# Patient Record
Sex: Female | Born: 1985 | Race: Black or African American | Hispanic: No | Marital: Married | Smoking: Never smoker
Health system: Southern US, Community
[De-identification: ages and names within clinical notes are randomized; demographics above are authoritative.]

## PROBLEM LIST (undated history)

## (undated) ENCOUNTER — Inpatient Hospital Stay (HOSPITAL_COMMUNITY): Payer: Self-pay

## (undated) DIAGNOSIS — J302 Other seasonal allergic rhinitis: Secondary | ICD-10-CM

## (undated) DIAGNOSIS — J40 Bronchitis, not specified as acute or chronic: Secondary | ICD-10-CM

---

## 2017-05-24 ENCOUNTER — Encounter (HOSPITAL_COMMUNITY): Payer: Self-pay | Admitting: Nurse Practitioner

## 2017-05-24 ENCOUNTER — Emergency Department (HOSPITAL_COMMUNITY)
Admission: EM | Admit: 2017-05-24 | Discharge: 2017-05-24 | Disposition: A | Discharge status: Home / Self Care | Payer: Self-pay | Attending: Emergency Medicine | Admitting: Emergency Medicine

## 2017-05-24 ENCOUNTER — Other Ambulatory Visit: Payer: Self-pay

## 2017-05-24 DIAGNOSIS — Y939 Activity, unspecified: Secondary | ICD-10-CM | POA: Insufficient documentation

## 2017-05-24 DIAGNOSIS — S0101XA Laceration without foreign body of scalp, initial encounter: Secondary | ICD-10-CM | POA: Insufficient documentation

## 2017-05-24 DIAGNOSIS — Y92410 Unspecified street and highway as the place of occurrence of the external cause: Secondary | ICD-10-CM | POA: Insufficient documentation

## 2017-05-24 DIAGNOSIS — Z79899 Other long term (current) drug therapy: Secondary | ICD-10-CM | POA: Insufficient documentation

## 2017-05-24 DIAGNOSIS — T07XXXA Unspecified multiple injuries, initial encounter: Secondary | ICD-10-CM

## 2017-05-24 DIAGNOSIS — Y999 Unspecified external cause status: Secondary | ICD-10-CM | POA: Insufficient documentation

## 2017-05-24 HISTORY — DX: Other seasonal allergic rhinitis: J30.2

## 2017-05-24 HISTORY — DX: Bronchitis, not specified as acute or chronic: J40

## 2017-05-24 MED ORDER — HYDROCODONE-ACETAMINOPHEN 5-325 MG PO TABS: 1.0000 | ORAL_TABLET | ORAL | 0 refills | Status: DC | PRN

## 2017-05-24 NOTE — Discharge Instructions (Signed)
Continue to use ice on the sore spots today and tomorrow after that use heat.  Clean the wound on your scalp daily with soap and water then apply an ointment such as bacitracin until it heals.  Return here, if needed, for problems.
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
## 2017-05-24 NOTE — ED Notes (Signed)
Pt updated about her wait time and our room situation. She verbalizes understanding.

## 2017-10-16 ENCOUNTER — Inpatient Hospital Stay (HOSPITAL_COMMUNITY): Payer: Self-pay

## 2017-10-16 ENCOUNTER — Other Ambulatory Visit: Payer: Self-pay

## 2017-10-16 ENCOUNTER — Inpatient Hospital Stay (HOSPITAL_COMMUNITY)
Admission: AD | Admit: 2017-10-16 | Discharge: 2017-10-16 | Disposition: A | Discharge status: Home / Self Care | Payer: Self-pay | Source: Ambulatory Visit | Attending: Obstetrics and Gynecology | Admitting: Obstetrics and Gynecology

## 2017-10-16 ENCOUNTER — Encounter (HOSPITAL_COMMUNITY): Payer: Self-pay | Admitting: *Deleted

## 2017-10-16 DIAGNOSIS — R109 Unspecified abdominal pain: Secondary | ICD-10-CM | POA: Insufficient documentation

## 2017-10-16 DIAGNOSIS — O26891 Other specified pregnancy related conditions, first trimester: Secondary | ICD-10-CM | POA: Insufficient documentation

## 2017-10-16 DIAGNOSIS — Z3A01 Less than 8 weeks gestation of pregnancy: Secondary | ICD-10-CM | POA: Insufficient documentation

## 2017-10-16 DIAGNOSIS — Z3491 Encounter for supervision of normal pregnancy, unspecified, first trimester: Secondary | ICD-10-CM

## 2017-10-16 DIAGNOSIS — O26899 Other specified pregnancy related conditions, unspecified trimester: Secondary | ICD-10-CM

## 2017-10-16 LAB — CBC
HEMATOCRIT: 40.9 % (ref 36.0–46.0)
Hemoglobin: 13.8 g/dL (ref 12.0–15.0)
MCH: 28.4 pg (ref 26.0–34.0)
MCHC: 33.7 g/dL (ref 30.0–36.0)
MCV: 84.2 fL (ref 78.0–100.0)
Platelets: 256 10*3/uL (ref 150–400)
RBC: 4.86 MIL/uL (ref 3.87–5.11)
RDW: 13.1 % (ref 11.5–15.5)
WBC: 14.9 10*3/uL — ABNORMAL HIGH (ref 4.0–10.5)

## 2017-10-16 LAB — URINALYSIS, ROUTINE W REFLEX MICROSCOPIC
BILIRUBIN URINE: NEGATIVE
GLUCOSE, UA: NEGATIVE mg/dL
HGB URINE DIPSTICK: NEGATIVE
Ketones, ur: 80 mg/dL — AB
LEUKOCYTES UA: NEGATIVE
NITRITE: NEGATIVE
PROTEIN: 100 mg/dL — AB
SPECIFIC GRAVITY, URINE: 1.035 — AB (ref 1.005–1.030)
pH: 6 (ref 5.0–8.0)

## 2017-10-16 LAB — WET PREP, GENITAL
CLUE CELLS WET PREP: NONE SEEN
SPERM: NONE SEEN
Trich, Wet Prep: NONE SEEN
Yeast Wet Prep HPF POC: NONE SEEN

## 2017-10-16 LAB — POCT PREGNANCY, URINE: PREG TEST UR: POSITIVE — AB

## 2017-10-16 LAB — ABO/RH: ABO/RH(D): A POS

## 2017-10-16 LAB — HCG, QUANTITATIVE, PREGNANCY: hCG, Beta Chain, Quant, S: 52998 m[IU]/mL — ABNORMAL HIGH (ref <5)

## 2017-10-16 MED ORDER — METOCLOPRAMIDE HCL 10 MG PO TABS: 10.0000 mg | ORAL_TABLET | Freq: Four times a day (QID) | ORAL | 0 refills | Status: DC

## 2017-10-16 MED ORDER — LACTATED RINGERS IV BOLUS
1000.0000 mL | Freq: Once | INTRAVENOUS | Status: AC
  Administered 2017-10-16: 1000 mL via INTRAVENOUS

## 2017-10-16 MED ORDER — ONDANSETRON 8 MG PO TBDP: 8.0000 mg | ORAL_TABLET | Freq: Three times a day (TID) | ORAL | 0 refills | Status: DC | PRN

## 2017-10-16 MED ORDER — GLYCOPYRROLATE 1 MG PO TABS: 1.0000 mg | ORAL_TABLET | Freq: Three times a day (TID) | ORAL | 0 refills | Status: DC

## 2017-10-16 MED ORDER — ONDANSETRON HCL 4 MG/2ML IJ SOLN
4.0000 mg | Freq: Once | INTRAMUSCULAR | Status: AC
  Administered 2017-10-16: 4 mg via INTRAVENOUS
  Filled 2017-10-16: qty 2

## 2017-10-16 MED ORDER — M.V.I. ADULT IV INJ
Freq: Once | INTRAVENOUS | Status: AC
  Administered 2017-10-16: 20:00:00 via INTRAVENOUS
  Filled 2017-10-16: qty 10

## 2017-10-16 MED ORDER — RANITIDINE HCL 150 MG PO TABS: 150.0000 mg | ORAL_TABLET | Freq: Two times a day (BID) | ORAL | 0 refills | Status: DC

## 2017-10-16 MED ORDER — PROMETHAZINE HCL 25 MG PO TABS: 25.0000 mg | ORAL_TABLET | Freq: Four times a day (QID) | ORAL | 0 refills | Status: DC | PRN

## 2017-10-16 MED ORDER — GLYCOPYRROLATE 0.2 MG/ML IJ SOLN
0.1000 mg | Freq: Once | INTRAMUSCULAR | Status: AC
  Administered 2017-10-16: 0.1 mg via INTRAVENOUS
  Filled 2017-10-16: qty 0.5

## 2017-10-16 MED ORDER — FAMOTIDINE IN NACL 20-0.9 MG/50ML-% IV SOLN
20.0000 mg | Freq: Once | INTRAVENOUS | Status: AC
Start: 2017-10-16 — End: 2017-10-16
  Administered 2017-10-16: 20 mg via INTRAVENOUS
  Filled 2017-10-16: qty 50

## 2017-10-16 MED ORDER — PROMETHAZINE HCL 25 MG/ML IJ SOLN
25.0000 mg | Freq: Once | INTRAMUSCULAR | Status: AC
  Administered 2017-10-16: 25 mg via INTRAVENOUS
  Filled 2017-10-16: qty 1
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
## 2017-10-16 NOTE — Discharge Instructions (Signed)
[facility]    [facility]       Phone: [phone]  [facility] Phone: [phone]  [facility] [place]  Phone: [phone]  [facility]  Phone: [phone]  [facility] [place]  Phone: [phone]  [facility]       Phone: [phone]  [facility]    Phone: [phone]   [facility]    Phone: [phone]  [facility]    Phone: [phone]  [facility]    Phone: [phone]  [facility]    Phone: [phone]  [facility]       Phone: [phone]   [facility]  Phone: [phone]  [facility]    Phone: [phone]  [facility] [place]   Phone: [phone]  [facility]      Phone: [phone]  [facility]    Phone: [phone]   First Trimester of Pregnancy The first trimester of pregnancy is from week 1 until the end of week 13 (months 1 through 3). A week after a sperm fertilizes an egg, the egg will implant on the wall of the uterus. This embryo will begin to develop into a baby. Genes from you and your partner will form the baby. The female genes will determine whether the baby will be a boy or a girl. At 6-8 weeks, the eyes and face will be formed, and the heartbeat can be seen on ultrasound. At the end of 12 weeks, all the baby's organs will be formed. Now that you are pregnant, you will want to do everything you can to have a healthy baby. Two of the most important things are to get good prenatal care and to follow your health care provider's instructions. Prenatal care is all the medical care you receive before the baby's birth.  This care will help prevent, find, and treat any problems during the pregnancy and childbirth. Body changes during your first trimester Your body goes through many changes during pregnancy. The changes vary from woman to woman.  You may gain or lose a couple of pounds at first.  You may feel sick to your stomach (nauseous) and you may throw up (vomit). If the vomiting is uncontrollable, call your health care provider.  You may tire easily.  You may develop headaches that can be relieved by medicines. All medicines should be approved by your health care provider.  You may urinate more often. Painful urination may mean you have a bladder infection.  You may develop heartburn as a result of your pregnancy.  You may develop constipation because certain hormones are causing the muscles that push stool through your intestines to slow down.  You may develop hemorrhoids or swollen veins (varicose veins).  Your breasts may begin to grow larger and become tender. Your nipples may stick out more, and the tissue that surrounds them (areola) may become darker.  Your gums may bleed and may be sensitive to brushing and flossing.  Dark spots or blotches (chloasma, mask of pregnancy) may develop on your face. This will likely fade after the baby is born.  Your menstrual periods will stop.  You may have a loss of appetite.  You may develop cravings for certain kinds of food.  You may have changes in your  emotions from day to day, such as being excited to be pregnant or being concerned that something may go wrong with the pregnancy and baby.  You may have more vivid and strange dreams.  You may have changes in your hair. These can include thickening of your hair, rapid growth, and changes in texture. Some women also have hair loss during or after pregnancy, or hair that feels dry or thin. Your hair will most likely return to normal after your baby is born.  What to expect at prenatal  visits During a routine prenatal visit:  You will be weighed to make sure you and the baby are growing normally.  Your blood pressure will be taken.  Your abdomen will be measured to track your baby's growth.  The fetal heartbeat will be listened to between weeks 10 and 14 of your pregnancy.  Test results from any previous visits will be discussed.  Your health care provider may ask you:  How you are feeling.  If you are feeling the baby move.  If you have had any abnormal symptoms, such as leaking fluid, bleeding, severe headaches, or abdominal cramping.  If you are using any tobacco products, including cigarettes, chewing tobacco, and electronic cigarettes.  If you have any questions.  Other tests that may be performed during your first trimester include:  Blood tests to find your blood type and to check for the presence of any previous infections. The tests will also be used to check for low iron levels (anemia) and protein on red blood cells (Rh antibodies). Depending on your risk factors, or if you previously had diabetes during pregnancy, you may have tests to check for high blood sugar that affects pregnant women (gestational diabetes).  Urine tests to check for infections, diabetes, or protein in the urine.  An ultrasound to confirm the proper growth and development of the baby.  Fetal screens for spinal cord problems (spina bifida) and Down syndrome.  HIV (human immunodeficiency virus) testing. Routine prenatal testing includes screening for HIV, unless you choose not to have this test.  You may need other tests to make sure you and the baby are doing well.  Follow these instructions at home: Medicines  Follow your health care provider's instructions regarding medicine use. Specific medicines may be either safe or unsafe to take during pregnancy.  Take a prenatal vitamin that contains at least 600 micrograms (mcg) of folic acid.  If you develop constipation, try  taking a stool softener if your health care provider approves. Eating and drinking  Eat a balanced diet that includes fresh fruits and vegetables, whole grains, good sources of protein such as meat, eggs, or tofu, and low-fat dairy. Your health care provider will help you determine the amount of weight gain that is right for you.  Avoid raw meat and uncooked cheese. These carry germs that can cause birth defects in the baby.  Eating four or five small meals rather than three large meals a day may help relieve nausea and vomiting. If you start to feel nauseous, eating a few soda crackers can be helpful. Drinking liquids between meals, instead of during meals, also seems to help ease nausea and vomiting.  Limit foods that are high in fat and processed sugars, such as fried and sweet foods.  To prevent constipation: ? Eat foods that are high in fiber, such as fresh fruits and vegetables, whole grains, and beans. ? Drink enough fluid to keep your urine clear or pale yellow. Activity  Exercise only as directed by your health care provider. Most women can continue their usual exercise routine during pregnancy. Try to exercise for 30 minutes at least 5 days a week. Exercising will help you: ? Control your weight. ? Stay in shape. ? Be prepared for labor and delivery.  Experiencing pain or cramping in the lower abdomen or lower back is a good sign that you should stop exercising. Check with your health care provider before continuing with normal exercises.  Try to avoid standing for long periods of time. Move your legs often if you must stand in one place for a long time.  Avoid heavy lifting.  Wear low-heeled shoes and practice good posture.  You may continue to have sex unless your health care provider tells you not to. Relieving pain and discomfort  Wear a good support bra to relieve breast tenderness.  Take warm sitz baths to soothe any pain or discomfort caused by hemorrhoids. Use  hemorrhoid cream if your health care provider approves.  Rest with your legs elevated if you have leg cramps or low back pain.  If you develop varicose veins in your legs, wear support hose. Elevate your feet for 15 minutes, 3-4 times a day. Limit salt in your diet. Prenatal care  Schedule your prenatal visits by the twelfth week of pregnancy. They are usually scheduled monthly at first, then more often in the last 2 months before delivery.  Write down your questions. Take them to your prenatal visits.  Keep all your prenatal visits as told by your health care provider. This is important. Safety  Wear your seat belt at all times when driving.  Make a list of emergency phone numbers, including numbers for family, friends, the hospital, and police and fire departments. General instructions  Ask your health care provider for a referral to a local prenatal education class. Begin classes no later than the beginning of month 6 of your pregnancy.  Ask for help if you have counseling or nutritional needs during pregnancy. Your health care provider can offer advice or refer you to specialists for help with various needs.  Do not use hot tubs, steam rooms, or saunas.  Do not douche or use tampons or scented sanitary pads.  Do not cross your legs for long periods of time.  Avoid cat litter boxes and soil used by cats. These carry germs that can cause birth defects in the baby and possibly loss of the fetus by miscarriage or stillbirth.  Avoid all smoking, herbs, alcohol, and medicines not prescribed by your health care provider. Chemicals in these products affect the formation and growth of the baby.  Do not use any products that contain nicotine or tobacco, such as cigarettes and e-cigarettes. If you need help quitting, ask your health care provider. You may receive counseling support and other resources to help you quit.  Schedule a dentist appointment. At home, brush your teeth with a soft  toothbrush and be gentle when you floss. Contact a health care provider if:  You have dizziness.  You have mild pelvic cramps, pelvic pressure, or nagging pain in the abdominal area.  You have persistent nausea, vomiting, or diarrhea.  You have a bad smelling vaginal discharge.  You have pain when you urinate.  You notice increased swelling in your face, hands, legs, or ankles.  You are exposed to fifth disease or chickenpox.  You are exposed to [place] measles (rubella) and have never had it. Get help right away if:  You have a fever.  You are leaking fluid from your vagina.  You have spotting or bleeding from your vagina.  You have severe abdominal cramping or pain.  You have rapid weight gain or loss.  You vomit blood or material that looks like coffee grounds.  You develop a severe headache.  You have shortness of breath.  You have any kind of trauma, such as from a fall or a car accident. Summary  The first trimester of pregnancy is from week 1 until the end of week 13 (months 1 through 3).  Your body goes through many changes during pregnancy. The changes vary from woman to woman.  You will have routine prenatal visits. During those visits, your health care provider will examine you, discuss any test results you may have, and talk with you about how you are feeling. This information is not intended to replace advice given to you by your health care provider. Make sure you discuss any questions you have with your health care provider. Document Released: 02/09/2001 Document Revised: 01/28/2016 Document Reviewed: 01/28/2016 Elsevier Interactive Patient Education  [employer]2018 [employer]

## 2017-10-16 NOTE — MAU Provider Note (Signed)
History     CSN: [id number]  Arrival date and time: 10/16/17 1612   First Provider Initiated Contact with Patient 10/16/17 1754      Chief Complaint  Patient presents with  . Abdominal Pain  . Emesis  . Possible Pregnancy   HPI Via Wynelle FannyOzuna is a 32 y.o. G3P0200 at 574w6d who presents  OB History    Gravida  3   Para  2   Term      Preterm  2   AB      Living        SAB      TAB      Ectopic      Multiple      Live Births              Past Medical History:  Diagnosis Date  . Bronchitis   . Seasonal allergies     No past surgical history on file.  No family history on file.  Social History   Tobacco Use  . Smoking status: Never Smoker  . Smokeless tobacco: Never Used  Substance Use Topics  . Alcohol use: Not Currently  . Drug use: Yes    Types: Marijuana    Allergies:  Allergies  Allergen Reactions  . Pollen Extract Hives, Shortness Of Breath and Itching  . Other Hives and Swelling    Pet Dander    Medications Prior to Admission  Medication Sig Dispense Refill Last Dose  . albuterol (PROVENTIL HFA;VENTOLIN HFA) 108 (90 Base) MCG/ACT inhaler USE 2 PUFFS PO Q 4 TO 6 H PRN SOB AND WHEEZING  0 Past Week at Unknown time  . cetirizine (ZYRTEC) 10 MG tablet Take 10 mg by mouth daily as needed for allergies.   05/23/2017 at Unknown time  . HYDROcodone-acetaminophen (NORCO) 5-325 MG tablet Take 1 tablet by mouth every 4 (four) hours as needed. 10 tablet 0     Review of Systems  Constitutional: Negative.  Negative for fatigue and fever.  HENT: Negative.   Respiratory: Negative.  Negative for shortness of breath.   Cardiovascular: Negative.  Negative for chest pain.  Gastrointestinal: Positive for abdominal pain, nausea and vomiting. Negative for constipation and diarrhea.  Genitourinary: Negative.  Negative for dysuria, vaginal bleeding and vaginal discharge.  Neurological: Negative.  Negative for dizziness and headaches.   Physical  Exam   Blood pressure 115/75, pulse 80, temperature 98.3 F (36.8 C), temperature source Oral, resp. rate 18, weight 57.3 kg, last menstrual period 08/03/2017, SpO2 99 %.  Physical Exam  Nursing note and vitals reviewed. Constitutional: She is oriented to person, place, and time. She appears well-developed and well-nourished. No distress.  HENT:  Head: Normocephalic.  Eyes: Pupils are equal, round, and reactive to light.  Cardiovascular: Normal rate, regular rhythm and normal heart sounds.  Respiratory: Effort normal and breath sounds normal. No respiratory distress.  GI: Soft. Bowel sounds are normal. She exhibits no distension. There is no tenderness.  Neurological: She is alert and oriented to person, place, and time.  Skin: Skin is warm and dry.  Psychiatric: She has a normal mood and affect. Her behavior is normal. Judgment and thought content normal.    MAU Course  Procedures Results for orders placed or performed during the hospital encounter of 10/16/17 (from the past 24 hour(s))  Urinalysis, Routine w reflex microscopic     Status: Abnormal   Collection Time: 10/16/17  4:54 PM  Result Value Ref Range   Color, Urine  AMBER (A) YELLOW   APPearance HAZY (A) CLEAR   Specific Gravity, Urine 1.035 (H) 1.005 - 1.030   pH 6.0 5.0 - 8.0   Glucose, UA NEGATIVE NEGATIVE mg/dL   Hgb urine dipstick NEGATIVE NEGATIVE   Bilirubin Urine NEGATIVE NEGATIVE   Ketones, ur 80 (A) NEGATIVE mg/dL   Protein, ur 409 (A) NEGATIVE mg/dL   Nitrite NEGATIVE NEGATIVE   Leukocytes, UA NEGATIVE NEGATIVE   RBC / HPF 0-5 0 - 5 RBC/hpf   WBC, UA 0-5 0 - 5 WBC/hpf   Bacteria, UA RARE (A) NONE SEEN   Squamous Epithelial / LPF 0-5 0 - 5   Mucus PRESENT   Pregnancy, urine POC     Status: Abnormal   Collection Time: 10/16/17  4:57 PM  Result Value Ref Range   Preg Test, Ur POSITIVE (A) NEGATIVE  Wet prep, genital     Status: Abnormal   Collection Time: 10/16/17  5:53 PM  Result Value Ref Range    Yeast Wet Prep HPF POC NONE SEEN NONE SEEN   Trich, Wet Prep NONE SEEN NONE SEEN   Clue Cells Wet Prep HPF POC NONE SEEN NONE SEEN   WBC, Wet Prep HPF POC FEW (A) NONE SEEN   Sperm NONE SEEN   CBC     Status: Abnormal   Collection Time: 10/16/17  6:11 PM  Result Value Ref Range   WBC 14.9 (H) 4.0 - 10.5 K/uL   RBC 4.86 3.87 - 5.11 MIL/uL   Hemoglobin 13.8 12.0 - 15.0 g/dL   HCT 81.1 91.4 - 78.2 %   MCV 84.2 78.0 - 100.0 fL   MCH 28.4 26.0 - 34.0 pg   MCHC 33.7 30.0 - 36.0 g/dL   RDW 95.6 21.3 - 08.6 %   Platelets 256 150 - 400 K/uL  hCG, quantitative, pregnancy     Status: Abnormal   Collection Time: 10/16/17  6:11 PM  Result Value Ref Range   hCG, Beta Chain, Quant, S 52,998 (H) <5 mIU/mL  ABO/Rh     Status: None (Preliminary result)   Collection Time: 10/16/17  6:11 PM  Result Value Ref Range   ABO/RH(D)      A POS Performed at [facility], [address], [place], [place] [address]    US Ob Less Than 14 Weeks With Ob Transvaginal  Result Date: 10/16/2017 CLINICAL DATA:  Lower abdominal pain EXAM: OBSTETRIC <14 WK [place] AND TRANSVAGINAL OB US TECHNIQUE: Both transabdominal and transvaginal ultrasound examinations were performed for complete evaluation of the gestation as well as the maternal uterus, adnexal regions, and pelvic cul-de-sac. Transvaginal technique was performed to assess early pregnancy. COMPARISON:  None. FINDINGS: Intrauterine gestational sac: Single intrauterine gestational sac Yolk sac:  Visible Embryo:  Visible Cardiac Activity: Visible Heart Rate: 104 bpm CRL: 3.5 mm   5 w   6 d                  [place] EDC: 06/12/2018 Subchorionic hemorrhage:  None visualized. Maternal uterus/adnexae: Ovaries are within normal limits. The right ovary measures 3.3 by 1.8 x 2.4 cm and contains a small corpus luteal cyst. The left ovary measures 3 x 1 x 1.4 cm. There is no significant free fluid. IMPRESSION: Single viable intrauterine pregnancy as above. There are no specific  abnormalities seen. Electronically Signed   By: Jasmine Pang M.D.   On: 10/16/2017 19:12   MDM UA, UPT CBC, HCG, ABO/Rh Wet prep and gc/chlamydia US OB Comp  Less 14 weeks with Transvaginal LR bolus Phenergan IV Pepcid IV Multivitamin in LR bolus Zofran 4mg  IV Robinul IV  Patient reporting relief from nausea. Requesting all medications for nausea because she states in previous pregnancies she has needed all of the meds.   Assessment and Plan   1. Normal IUP (intrauterine pregnancy) on prenatal ultrasound, first trimester   2. Abdominal pain affecting pregnancy   3. [redacted] weeks gestation of pregnancy    -Discharge home in stable condition -Rx for zofran, phenergan, reglan, robinul and pepcid given to patient. -First trimester vaginal bleeding and pain precautions discussed -Patient advised to follow-up with OB of choice for prenatal care -Patient may return to MAU as needed or if her condition were to change or worsen  Rolm BookbinderCaroline M Neill CNM 10/16/2017, 5:54 PM

## 2017-10-17 LAB — GC/CHLAMYDIA PROBE AMP (~~LOC~~) NOT AT ARMC
CHLAMYDIA, DNA PROBE: NEGATIVE
Neisseria Gonorrhea: NEGATIVE

## 2018-09-04 ENCOUNTER — Encounter (HOSPITAL_COMMUNITY): Payer: Self-pay

## 2019-12-28 IMAGING — US US OB < 14 WEEKS - US OB TV
1 series · 15 of 28 positions shown · non-contrast
Comparison: None.

CLINICAL DATA: Lower abdominal pain

EXAM:
OBSTETRIC <14 WK US AND TRANSVAGINAL OB US
TECHNIQUE: Both transabdominal and transvaginal ultrasound examinations were
performed for complete evaluation of the gestation as well as the
maternal uterus, adnexal regions, and pelvic cul-de-sac.
Transvaginal technique was performed to assess early pregnancy.

[Series 1: us ob < 14 weeks - us ob tv · 15 of 34 slices shown]
[im 1/34]
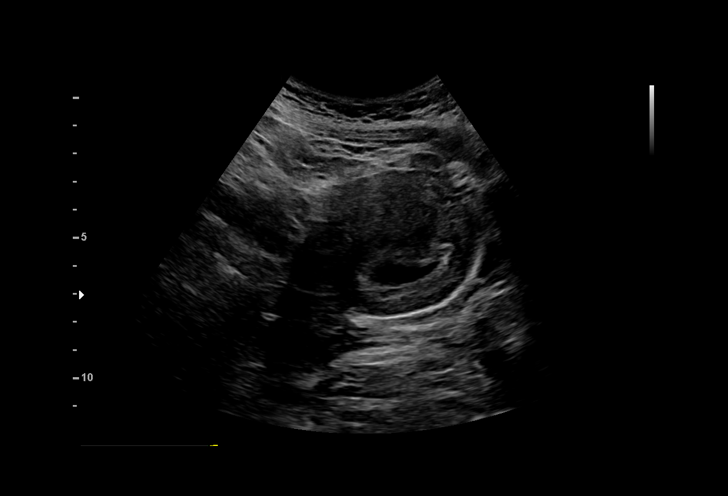
[im 3/34]
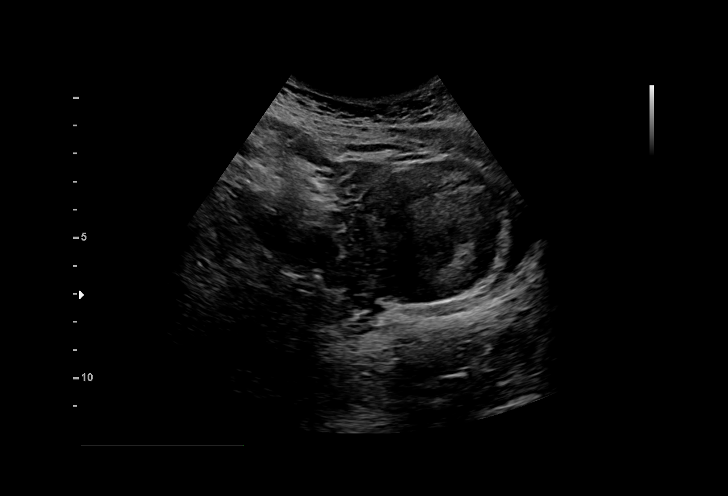
[im 5/34]
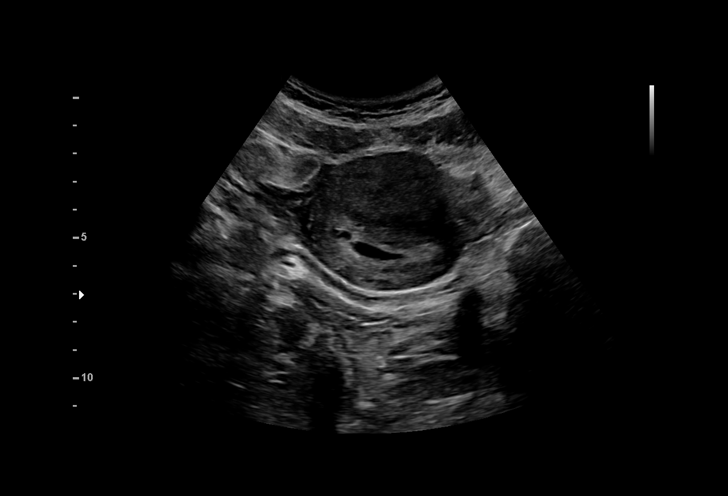
[im 8/34]
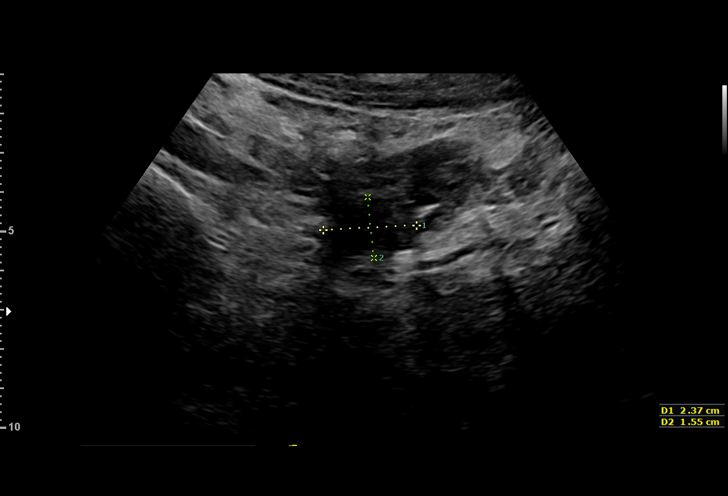
[im 10/34]
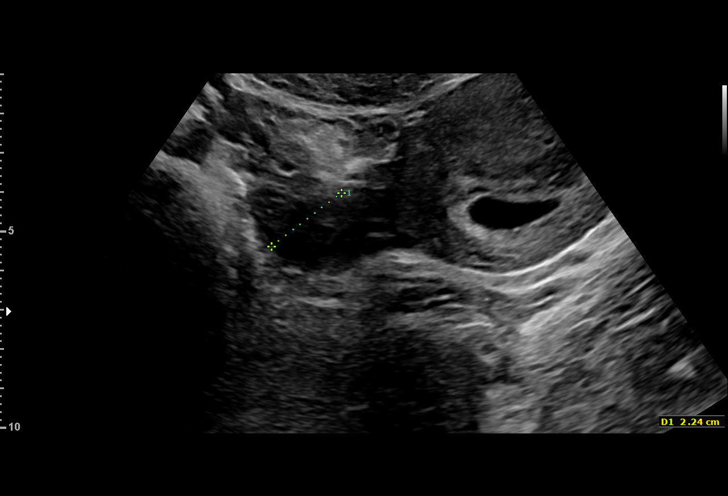
[im 13/34]
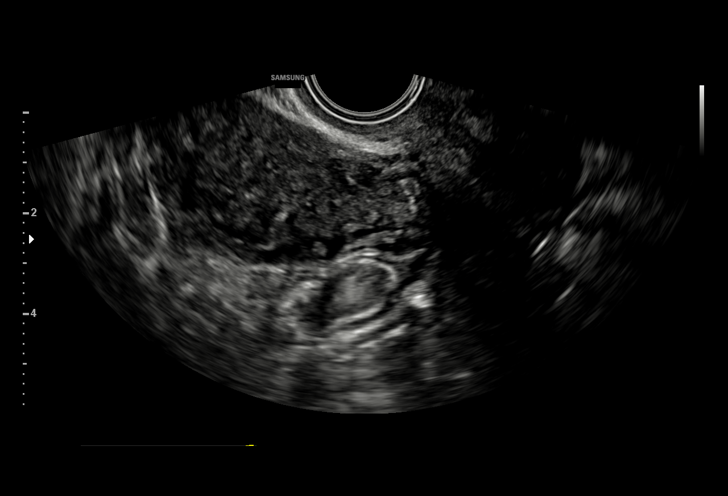
[im 15/34]
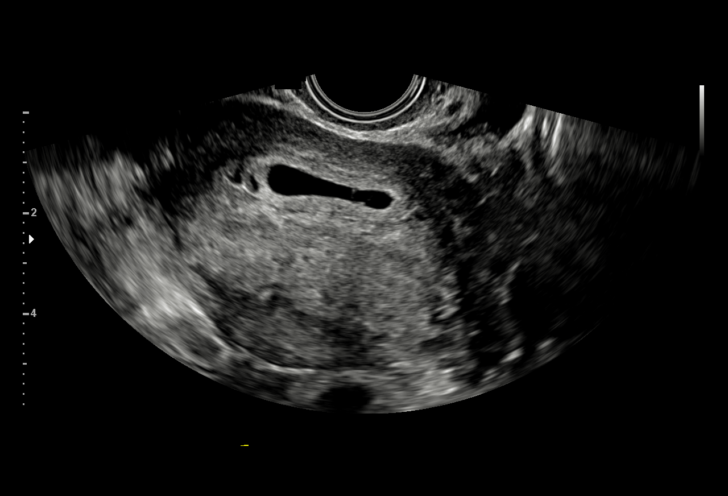
[im 18/34]
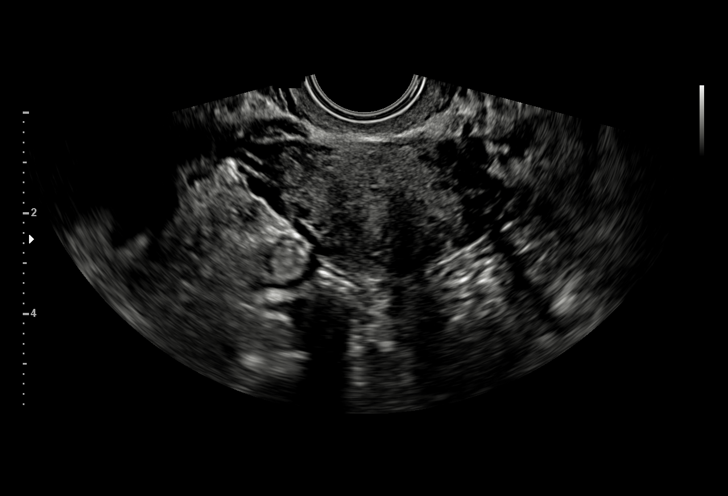
[im 19/34]
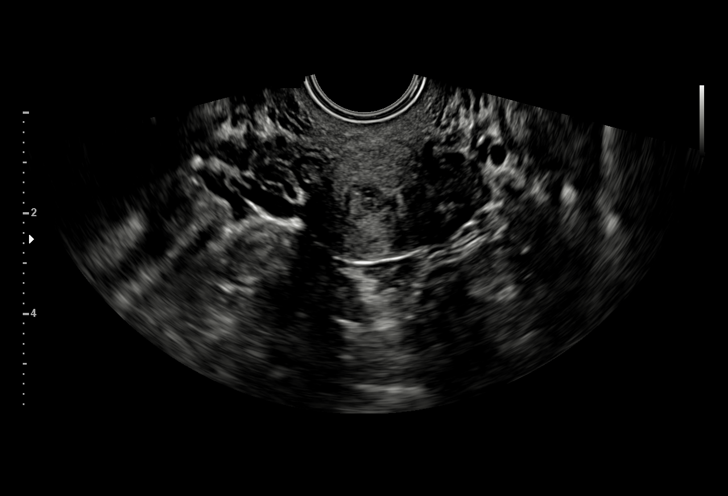
[im 21/34]
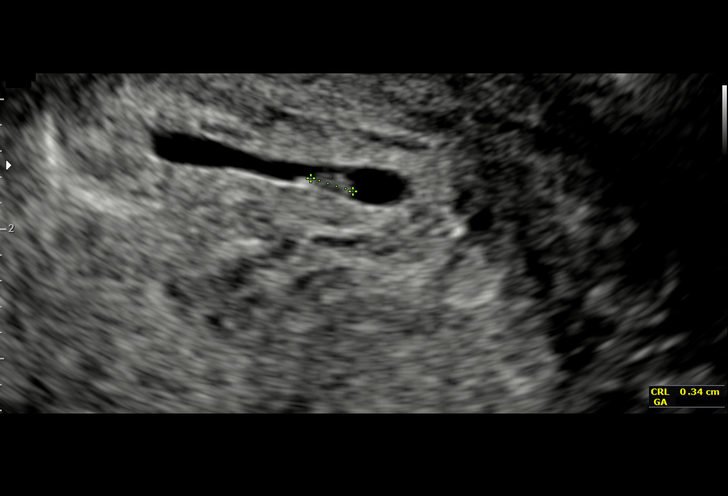
[im 24/34]
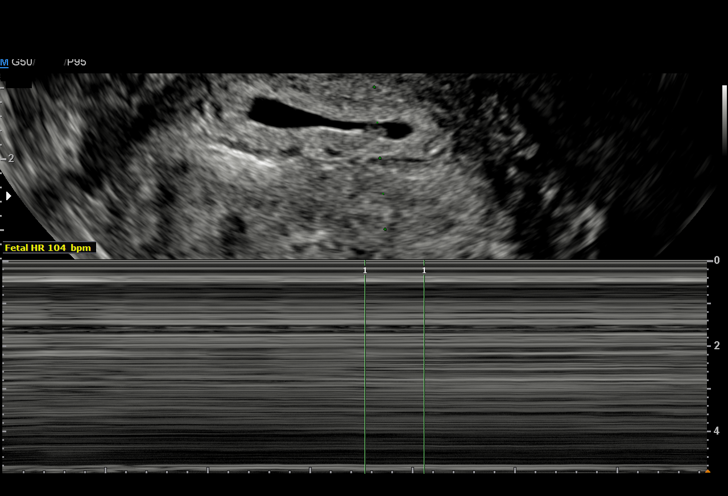
[im 26/34]
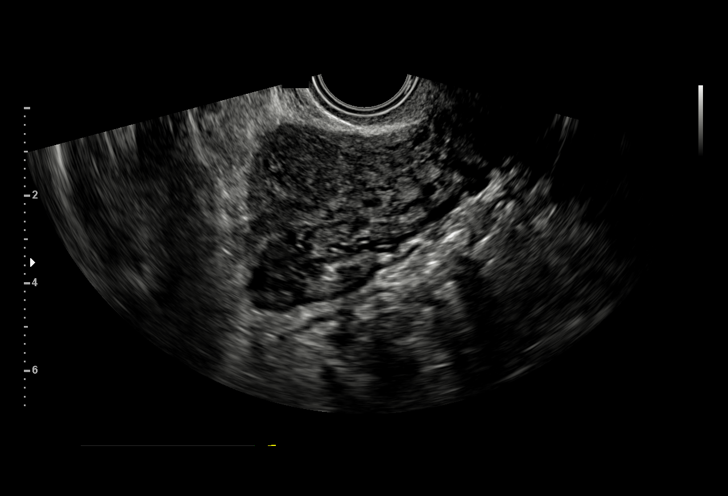
[im 29/34]
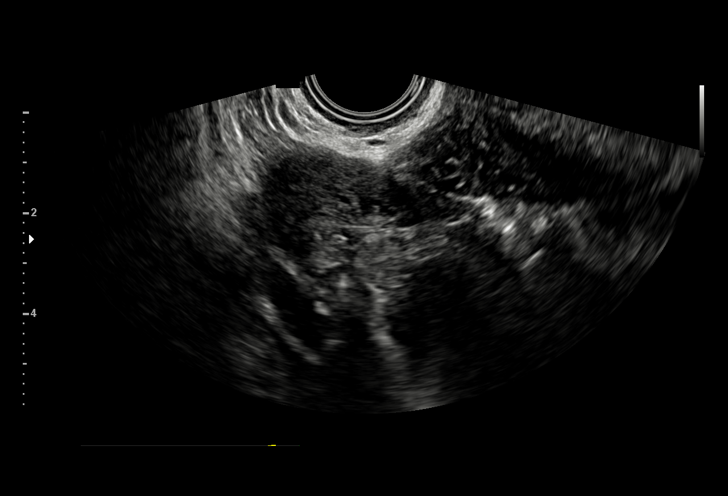
[im 31/34]
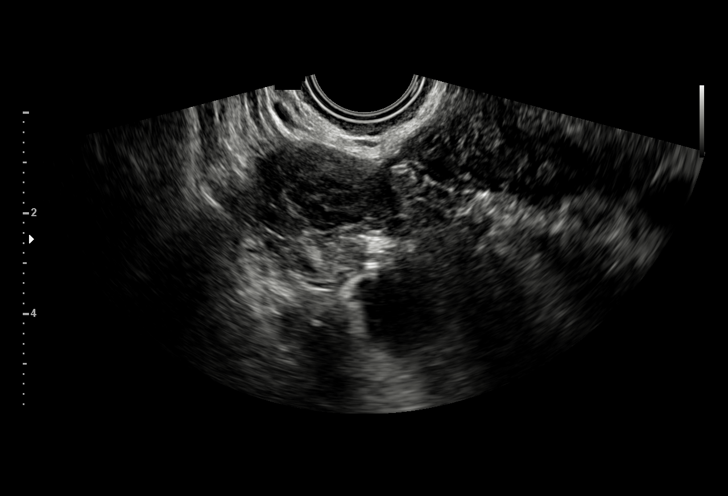
[im 34/34]
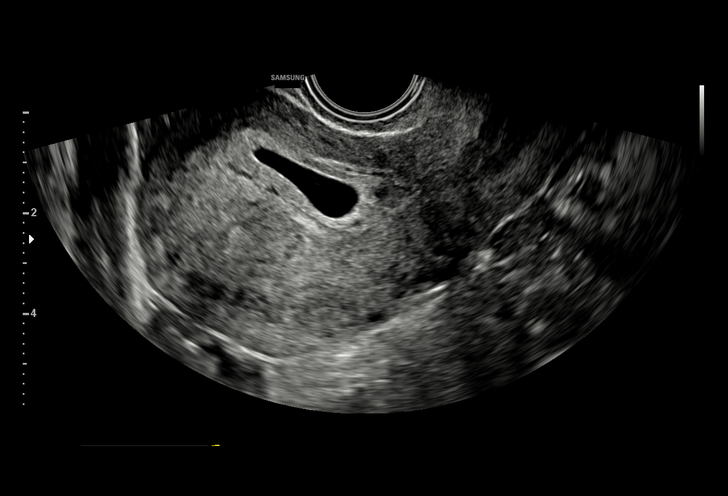

[15 of 28 positions shown; findings below may reference images not displayed]

FINDINGS: Intrauterine gestational sac: Single intrauterine gestational sac

Yolk sac:  Visible

Embryo:  Visible

Cardiac Activity: Visible

Heart Rate: 104 bpm

CRL: 3.5 mm   5 w   6 d                  US EDC: 06/12/2018

Subchorionic hemorrhage:  None visualized.

Maternal uterus/adnexae: Ovaries are within normal limits. The right
ovary measures 3.3 by 1.8 x 2.4 cm and contains a small corpus
luteal cyst. The left ovary measures 3 x 1 x 1.4 cm. There is no
significant free fluid.
IMPRESSION: Single viable intrauterine pregnancy as above. There are no specific
abnormalities seen.

## 2020-11-10 ENCOUNTER — Encounter (HOSPITAL_COMMUNITY): Payer: Self-pay | Admitting: Emergency Medicine

## 2020-11-10 ENCOUNTER — Ambulatory Visit (HOSPITAL_COMMUNITY)
Admission: EM | Admit: 2020-11-10 | Discharge: 2020-11-10 | Disposition: A | Discharge status: Home / Self Care | Payer: Medicaid Other | Attending: Internal Medicine | Admitting: Internal Medicine

## 2020-11-10 ENCOUNTER — Other Ambulatory Visit: Payer: Self-pay

## 2020-11-10 DIAGNOSIS — O210 Mild hyperemesis gravidarum: Secondary | ICD-10-CM

## 2020-11-10 DIAGNOSIS — Z5321 Procedure and treatment not carried out due to patient leaving prior to being seen by health care provider: Secondary | ICD-10-CM | POA: Diagnosis not present

## 2020-11-10 DIAGNOSIS — Z3A00 Weeks of gestation of pregnancy not specified: Secondary | ICD-10-CM

## 2020-11-10 LAB — CBC WITH DIFFERENTIAL/PLATELET
Abs Immature Granulocytes: 0.04 10*3/uL (ref 0.00–0.07)
Basophils Absolute: 0.1 10*3/uL (ref 0.0–0.1)
Basophils Relative: 1 %
Eosinophils Absolute: 0.2 10*3/uL (ref 0.0–0.5)
Eosinophils Relative: 1 %
HCT: 36.5 % (ref 36.0–46.0)
Hemoglobin: 12 g/dL (ref 12.0–15.0)
Immature Granulocytes: 0 %
Lymphocytes Relative: 19 %
Lymphs Abs: 2.3 10*3/uL (ref 0.7–4.0)
MCH: 28.3 pg (ref 26.0–34.0)
MCHC: 32.9 g/dL (ref 30.0–36.0)
MCV: 86.1 fL (ref 80.0–100.0)
Monocytes Absolute: 1 10*3/uL (ref 0.1–1.0)
Monocytes Relative: 8 %
Neutro Abs: 8.5 10*3/uL — ABNORMAL HIGH (ref 1.7–7.7)
Neutrophils Relative %: 71 %
Platelets: 230 10*3/uL (ref 150–400)
RBC: 4.24 MIL/uL (ref 3.87–5.11)
RDW: 12.7 % (ref 11.5–15.5)
WBC: 12.1 10*3/uL — ABNORMAL HIGH (ref 4.0–10.5)
nRBC: 0 % (ref 0.0–0.2)

## 2020-11-10 LAB — BASIC METABOLIC PANEL
Anion gap: 7 (ref 5–15)
BUN: 9 mg/dL (ref 6–20)
CO2: 22 mmol/L (ref 22–32)
Calcium: 8.7 mg/dL — ABNORMAL LOW (ref 8.9–10.3)
Chloride: 104 mmol/L (ref 98–111)
Creatinine, Ser: 0.45 mg/dL (ref 0.44–1.00)
GFR, Estimated: >60 mL/min (ref >60)
Glucose, Bld: 87 mg/dL (ref 70–99)
Potassium: 3.3 mmol/L — ABNORMAL LOW (ref 3.5–5.1)
Sodium: 133 mmol/L — ABNORMAL LOW (ref 135–145)

## 2020-11-10 LAB — POC URINE PREG, ED: Preg Test, Ur: POSITIVE — AB

## 2020-11-10 MED ORDER — SODIUM CHLORIDE 0.9 % IV BOLUS
500.0000 mL | Freq: Once | INTRAVENOUS | Status: AC
  Administered 2020-11-10: 500 mL via INTRAVENOUS

## 2020-11-10 MED ORDER — DOXYLAMINE-PYRIDOXINE 10-10 MG PO TBEC: 1.0000 | DELAYED_RELEASE_TABLET | Freq: Two times a day (BID) | ORAL | 0 refills | Status: DC

## 2020-11-10 MED ORDER — SODIUM CHLORIDE 0.9 % IV BOLUS: 1000.0000 mL | Freq: Once | INTRAVENOUS | Status: DC

## 2020-11-10 NOTE — ED Provider Notes (Signed)
[facility]    CSN: [id number] Arrival date & time: 11/10/20  1437      History   Chief Complaint Chief Complaint  Patient presents with   Emesis During Pregnancy    HPI Gail Sims is a 35 y.o. female comes to the urgent care with vomiting and nausea over the past several days.  Patient recently found out she was pregnant.  Her last menstrual period was end of July.  She has had nausea and vomiting with poor oral intake over the past several days.  She feels weak but denies any dizziness or near syncopal episodes.  She has tried Dramamine over-the-counter with no improvement in his symptoms.  She is also taking Nexium for heartburn.  No abdominal pain or cramping.  She has some epigastric discomfort but no lower abdominal pain.Marland Kitchen   HPI  Past Medical History:  Diagnosis Date   Bronchitis    Seasonal allergies     There are no problems to display for this patient.   Past Surgical History:  Procedure Laterality Date   CESAREAN SECTION      OB History     Gravida  4   Para  2   Term      Preterm  2   AB      Living         SAB      IAB      Ectopic      Multiple      Live Births               Home Medications    Prior to Admission medications   Medication Sig Start Date End Date Taking? Authorizing Provider  Doxylamine-Pyridoxine 10-10 MG TBEC Take 1 tablet by mouth in the morning and at bedtime. 11/10/20  Yes Chistopher Mangino, Britta Mccreedy, MD  esomeprazole (NEXIUM) 20 MG capsule Take 20 mg by mouth daily at 12 noon.   Yes [provider]  meclizine (ANTIVERT) 25 MG tablet Take 25 mg by mouth 3 (three) times daily as needed for dizziness.   Yes [provider]  albuterol (PROVENTIL HFA;VENTOLIN HFA) 108 (90 Base) MCG/ACT inhaler USE 2 PUFFS PO Q 4 TO 6 H PRN SOB AND WHEEZING 04/26/17   [provider]    Family History No family history on file.  Social History Social History   Tobacco Use   Smoking  status: Never   Smokeless tobacco: Never  Substance Use Topics   Alcohol use: Not Currently   Drug use: Not Currently    Types: Marijuana     Allergies   Pollen extract and Other   Review of Systems Review of Systems  HENT: Negative.    Respiratory: Negative.    Cardiovascular: Negative.   Gastrointestinal:  Positive for abdominal pain, nausea and vomiting. Negative for constipation and diarrhea.  Genitourinary: Negative.   Neurological: Negative.     Physical Exam Triage Vital Signs ED Triage Vitals  Enc Vitals Group     BP 11/10/20 1509 111/78     Pulse Rate 11/10/20 1509 89     Resp 11/10/20 1509 17     Temp 11/10/20 1509 98.5 F (36.9 C)     Temp Source 11/10/20 1509 Oral     SpO2 11/10/20 1509 97 %     Weight --      Height --      Head Circumference --      Peak Flow --  Pain Score 11/10/20 1506 9     Pain Loc --      Pain Edu? --      Excl. in GC? --    No data found.  Updated Vital Signs BP 111/78 (BP Location: Left Arm)   Pulse 89   Temp 98.5 F (36.9 C) (Oral)   Resp 17   LMP 08/29/2020   SpO2 97%   Visual Acuity Right Eye Distance:   Left Eye Distance:   Bilateral Distance:    Right Eye Near:   Left Eye Near:    Bilateral Near:     Physical Exam Vitals and nursing note reviewed.  Constitutional:      General: She is not in acute distress.    Appearance: She is not ill-appearing.  Cardiovascular:     Rate and Rhythm: Normal rate and regular rhythm.     Pulses: Normal pulses.     Heart sounds: Normal heart sounds.  Pulmonary:     Effort: Pulmonary effort is normal.     Breath sounds: Normal breath sounds.  Abdominal:     General: Abdomen is flat. Bowel sounds are normal.  Musculoskeletal:        General: Normal range of motion.  Skin:    General: Skin is warm.  Neurological:     Mental Status: She is alert.     UC Treatments / Results  Labs (all labs ordered are listed, but only abnormal results are displayed) Labs  Reviewed  POC URINE PREG, ED - Abnormal; Notable for the following components:      Result Value   Preg Test, Ur POSITIVE (*)    All other components within normal limits    EKG   Radiology No results found.  Procedures Procedures (including critical care time)  Medications Ordered in UC Medications  sodium chloride 0.9 % bolus 500 mL (500 mLs Intravenous New Bag/Given 11/10/20 1615)    Initial Impression / Assessment and Plan / UC Course  I have reviewed the triage vital signs and the nursing notes.  Pertinent labs & imaging results that were available during my care of the patient were reviewed by me and considered in my medical decision making (see chart for details).     1.  Hyperemesis gravidarum: Normal saline 500 mL bolus CBC, BMP Urine pregnancy test is positive Doxylamine-pyridoxine twice daily Patient encouraged to establish OB care If symptoms worsen patient advised to go to the MAU for further evaluation. Final Clinical Impressions(s) / UC Diagnoses   Final diagnoses:  None   Discharge Instructions   None    ED Prescriptions     Medication Sig Dispense Auth. Provider   Doxylamine-Pyridoxine 10-10 MG TBEC Take 1 tablet by mouth in the morning and at bedtime. 60 tablet Ambrosio Reuter, Britta Mccreedy, MD      PDMP not reviewed this encounter.   Merrilee Jansky, MD 11/10/20 [phone]

## 2020-11-10 NOTE — Discharge Instructions (Addendum)
Please take medications as prescribed Please establish OB care Increase oral fluid intake If you have worsening symptoms please go to the maternity assessment unit for [facility] health.

## 2020-11-10 NOTE — ED Triage Notes (Signed)
Pt reports that she recently found out she was pregnant. Reports been having vomiting and not able to keep anything down. No BM in 3 days. With first pregnancy had hyperemesis

## 2020-11-12 ENCOUNTER — Other Ambulatory Visit: Payer: Self-pay

## 2020-11-12 ENCOUNTER — Encounter (HOSPITAL_COMMUNITY): Payer: Self-pay | Admitting: Obstetrics and Gynecology

## 2020-11-12 ENCOUNTER — Inpatient Hospital Stay (HOSPITAL_COMMUNITY)
Admission: AD | Admit: 2020-11-12 | Discharge: 2020-11-12 | Discharge status: Against Medical Advice | Payer: Medicaid Other | Attending: Obstetrics and Gynecology | Admitting: Obstetrics and Gynecology

## 2020-11-12 DIAGNOSIS — Z5321 Procedure and treatment not carried out due to patient leaving prior to being seen by health care provider: Secondary | ICD-10-CM | POA: Diagnosis not present

## 2020-11-12 LAB — URINALYSIS, ROUTINE W REFLEX MICROSCOPIC
Bilirubin Urine: NEGATIVE
Glucose, UA: NEGATIVE mg/dL
Hgb urine dipstick: NEGATIVE
Ketones, ur: 80 mg/dL — AB
Leukocytes,Ua: NEGATIVE
Nitrite: NEGATIVE
Protein, ur: 30 mg/dL — AB
Specific Gravity, Urine: 1.031 — ABNORMAL HIGH (ref 1.005–1.030)
pH: 7 (ref 5.0–8.0)

## 2020-11-12 NOTE — MAU Note (Signed)
Pt called, not in lobby x2 

## 2020-11-12 NOTE — MAU Note (Signed)
Pt called, not in lobby x1 

## 2020-11-12 NOTE — MAU Note (Signed)
Pt called, not in lobby x3 

## 2020-11-12 NOTE — MAU Note (Signed)
Gail Sims is a 35 y.o. at [redacted]w[redacted]d here in MAU reporting: ongoing nausea, was Rx zofran from UC and that is not helping. States unable to keep down food. 7-8 episodes of emesis in the past 24 hours. No pain, bleeding, or discharge.  LMP: 08/29/2020  Onset of complaint: ongoing  Pain score: 0/10  Vitals:   11/12/20 1320  BP: 105/66  Pulse: 66  Resp: 16  Temp: 98.5 F (36.9 C)  SpO2: 100%     [id number]  Lab orders placed from triage: UA

## 2020-11-13 ENCOUNTER — Encounter (HOSPITAL_COMMUNITY): Payer: Self-pay | Admitting: Obstetrics and Gynecology

## 2020-11-13 ENCOUNTER — Inpatient Hospital Stay (HOSPITAL_COMMUNITY)
Admission: AD | Admit: 2020-11-13 | Discharge: 2020-11-13 | Disposition: A | Discharge status: Home / Self Care | Payer: Medicaid Other | Attending: Obstetrics and Gynecology | Admitting: Obstetrics and Gynecology

## 2020-11-13 ENCOUNTER — Other Ambulatory Visit: Payer: Self-pay

## 2020-11-13 DIAGNOSIS — R42 Dizziness and giddiness: Secondary | ICD-10-CM | POA: Insufficient documentation

## 2020-11-13 DIAGNOSIS — O219 Vomiting of pregnancy, unspecified: Secondary | ICD-10-CM | POA: Insufficient documentation

## 2020-11-13 DIAGNOSIS — Z3A10 10 weeks gestation of pregnancy: Secondary | ICD-10-CM | POA: Insufficient documentation

## 2020-11-13 DIAGNOSIS — R1030 Lower abdominal pain, unspecified: Secondary | ICD-10-CM | POA: Diagnosis not present

## 2020-11-13 DIAGNOSIS — R109 Unspecified abdominal pain: Secondary | ICD-10-CM | POA: Diagnosis not present

## 2020-11-13 DIAGNOSIS — O26891 Other specified pregnancy related conditions, first trimester: Secondary | ICD-10-CM | POA: Diagnosis not present

## 2020-11-13 LAB — COMPREHENSIVE METABOLIC PANEL
ALT: 14 U/L (ref 0–44)
AST: 20 U/L (ref 15–41)
Albumin: 4.1 g/dL (ref 3.5–5.0)
Alkaline Phosphatase: 44 U/L (ref 38–126)
Anion gap: 11 (ref 5–15)
BUN: 9 mg/dL (ref 6–20)
CO2: 21 mmol/L — ABNORMAL LOW (ref 22–32)
Calcium: 9.3 mg/dL (ref 8.9–10.3)
Chloride: 101 mmol/L (ref 98–111)
Creatinine, Ser: 0.7 mg/dL (ref 0.44–1.00)
GFR, Estimated: >60 mL/min (ref >60)
Glucose, Bld: 88 mg/dL (ref 70–99)
Potassium: 3.2 mmol/L — ABNORMAL LOW (ref 3.5–5.1)
Sodium: 133 mmol/L — ABNORMAL LOW (ref 135–145)
Total Bilirubin: 1 mg/dL (ref 0.3–1.2)
Total Protein: 7.2 g/dL (ref 6.5–8.1)

## 2020-11-13 LAB — URINALYSIS, ROUTINE W REFLEX MICROSCOPIC
Bilirubin Urine: NEGATIVE
Glucose, UA: NEGATIVE mg/dL
Hgb urine dipstick: NEGATIVE
Ketones, ur: 80 mg/dL — AB
Leukocytes,Ua: NEGATIVE
Nitrite: NEGATIVE
Protein, ur: 30 mg/dL — AB
Specific Gravity, Urine: 1.035 — ABNORMAL HIGH (ref 1.005–1.030)
pH: 5 (ref 5.0–8.0)

## 2020-11-13 MED ORDER — LACTATED RINGERS IV BOLUS
1000.0000 mL | Freq: Once | INTRAVENOUS | Status: AC
  Administered 2020-11-13: 1000 mL via INTRAVENOUS

## 2020-11-13 MED ORDER — METOCLOPRAMIDE HCL 10 MG PO TABS: 10.0000 mg | ORAL_TABLET | Freq: Three times a day (TID) | ORAL | 2 refills | Status: DC | PRN

## 2020-11-13 MED ORDER — SODIUM CHLORIDE 0.9 % IV SOLN
25.0000 mg | Freq: Once | INTRAVENOUS | Status: AC
  Administered 2020-11-13: 25 mg via INTRAVENOUS
  Filled 2020-11-13: qty 1

## 2020-11-13 MED ORDER — PROMETHAZINE HCL 12.5 MG PO TABS: 12.5000 mg | ORAL_TABLET | Freq: Every evening | ORAL | 2 refills | Status: DC | PRN

## 2020-11-13 MED ORDER — GLYCOPYRROLATE 0.2 MG/ML IJ SOLN
0.1000 mg | Freq: Once | INTRAMUSCULAR | Status: AC
  Administered 2020-11-13: 0.1 mg via INTRAVENOUS
  Filled 2020-11-13: qty 1

## 2020-11-13 MED ORDER — GLYCOPYRROLATE 2 MG PO TABS: 2.0000 mg | ORAL_TABLET | Freq: Three times a day (TID) | ORAL | 3 refills | Status: DC | PRN

## 2020-11-13 NOTE — Discharge Instructions (Signed)
Prenatal Care Providers           [facility]  [address] [phone]  [facility]   [address]  [phone]  [facility] [place]       [address] [phone]            [facility] @ [place]     [address] [phone]          [facility] @ [place]   [address] [phone]  [facility]  [address] [phone]     [facility])  [address]   [phone]     [facility]  Phone: [phone]  [facility]  Phone: [phone]  [facility] Phone: [phone]  [facility] Phone: [phone]  [facility] Phone: [phone]  [facility] Phone: [phone]  [facility]  Phone: [phone]  

## 2020-11-13 NOTE — MAU Note (Cosign Needed Addendum)
History     [id number]  Arrival date and time: 11/13/20 0930    Chief Complaint  Patient presents with   Emesis   Nausea   HPI Gail Sims is a 35 y.o. at [redacted]w[redacted]d by LMP with no significant PMHx who presents for emesis and nausea. She had episodes 7-10x a day that started 2.5 weeks ago that is exacerbated by eating and the smell of certain foods. She has noticed mild bleeding after each episode in her emesis. She endorses dizziness. Pt reported abdominal pain to nurse. She has no chest pain. Pt went to the Urgent Care on Monday and was given the diagnosis of hyperemesis gravidarum. She was started on Zofran, Nexium, and Doxylamine. Stated Nexium was moderately helpful   She admitted to MAU on Tuesday for ongoing nausea but left without being seen.   Vaginal bleeding: No LOF: No Fetal Movement: No Contractions: No   OB History     Gravida  3   Para  2   Term      Preterm  2   AB      Living  2      SAB      IAB      Ectopic      Multiple      Live Births              Past Medical History:  Diagnosis Date   Bronchitis    Seasonal allergies     Past Surgical History:  Procedure Laterality Date   CESAREAN SECTION      Family History  Problem Relation Age of Onset   Heart disease Mother    Cancer Mother    Cancer Maternal Grandfather     Social History   Socioeconomic History   Marital status: Married    Spouse name: Not on file   Number of children: Not on file   Years of education: Not on file   Highest education level: Not on file  Occupational History   Not on file  Tobacco Use   Smoking status: Never   Smokeless tobacco: Never  Substance and Sexual Activity   Alcohol use: Not Currently   Drug use: Not Currently    Types: Marijuana    Comment: last used marijuana when found out she was pregnant as of 11/13/2020   Sexual activity: Not Currently  Other Topics Concern   Not on file  Social History Narrative   Not on file    Social Determinants of Health   Financial Resource Strain: Not on file  Food Insecurity: Not on file  Transportation Needs: Not on file  Physical Activity: Not on file  Stress: Not on file  Social Connections: Not on file  Intimate Partner Violence: Not on file    Allergies  Allergen Reactions   Pollen Extract Hives, Shortness Of Breath and Itching   Other Hives and Swelling    Pet Dander    No current facility-administered medications on file prior to encounter.   Current Outpatient Medications on File Prior to Encounter  Medication Sig Dispense Refill   esomeprazole (NEXIUM) 20 MG capsule Take 20 mg by mouth daily at 12 noon.     ondansetron (ZOFRAN) 4 MG tablet Take 4 mg by mouth every 8 (eight) hours as needed for nausea or vomiting.     albuterol (PROVENTIL HFA;VENTOLIN HFA) 108 (90 Base) MCG/ACT inhaler USE 2 PUFFS PO Q 4 TO 6 H PRN SOB AND WHEEZING  0  Doxylamine-Pyridoxine 10-10 MG TBEC Take 1 tablet by mouth in the morning and at bedtime. 60 tablet 0   meclizine (ANTIVERT) 25 MG tablet Take 25 mg by mouth 3 (three) times daily as needed for dizziness.       Review of Systems  Constitutional:  Negative for fever.  Respiratory:  Positive for sputum production and shortness of breath.   Cardiovascular:  Negative for chest pain.  Gastrointestinal:  Positive for constipation, nausea and vomiting.  Neurological:  Positive for dizziness and headaches.    Physical Exam   BP 108/70 (BP Location: Right Arm)   Pulse 84   Temp 98.4 F (36.9 C) (Oral)   Resp 19   Ht 5\' 5"  (1.651 m)   Wt 60.5 kg   LMP 08/29/2020   SpO2 99%   BMI 22.18 kg/m   Patient Vitals for the past 24 hrs:  BP Temp Temp src Pulse Resp SpO2 Height Weight  11/13/20 0946 108/70 98.4 F (36.9 C) Oral 84 19 99 % 5\' 5"  (1.651 m) 60.5 kg    Physical Exam Constitutional:      Appearance: She is not diaphoretic.  HENT:     Head: Normocephalic and atraumatic.  Pulmonary:     Effort: Pulmonary  effort is normal.  Abdominal:     Tenderness: There is no abdominal tenderness.  Neurological:     Mental Status: She is alert.   Cervical Exam Not performed   Bedside Ultrasound Yes   My interpretation: Confirmed IUP with visible yolk sac and heart beat. GA [redacted]wk   Labs Results for orders placed or performed during the hospital encounter of 11/13/20 (from the past 24 hour(s))  Comprehensive metabolic panel     Status: Abnormal   Collection Time: 11/13/20 10:05 AM  Result Value Ref Range   Sodium 133 (L) 135 - 145 mmol/L   Potassium 3.2 (L) 3.5 - 5.1 mmol/L   Chloride 101 98 - 111 mmol/L   CO2 21 (L) 22 - 32 mmol/L   Glucose, Bld 88 70 - 99 mg/dL   BUN 9 6 - 20 mg/dL   Creatinine, Ser 11/15/20 0.44 - 1.00 mg/dL   Calcium 9.3 8.9 - 11/15/20 mg/dL   Total Protein 7.2 6.5 - 8.1 g/dL   Albumin 4.1 3.5 - 5.0 g/dL   AST 20 15 - 41 U/L   ALT 14 0 - 44 U/L   Alkaline Phosphatase 44 38 - 126 U/L   Total Bilirubin 1.0 0.3 - 1.2 mg/dL   GFR, Estimated 6.26 94.8 mL/min   Anion gap 11 5 - 15  Urinalysis, Routine w reflex microscopic Urine, Clean Catch     Status: Abnormal   Collection Time: 11/13/20 10:47 AM  Result Value Ref Range   Color, Urine AMBER (A) YELLOW   APPearance CLOUDY (A) CLEAR   Specific Gravity, Urine 1.035 (H) 1.005 - 1.030   pH 5.0 5.0 - 8.0   Glucose, UA NEGATIVE NEGATIVE mg/dL   Hgb urine dipstick NEGATIVE NEGATIVE   Bilirubin Urine NEGATIVE NEGATIVE   Ketones, ur 80 (A) NEGATIVE mg/dL   Protein, ur 30 (A) NEGATIVE mg/dL   Nitrite NEGATIVE NEGATIVE   Leukocytes,Ua NEGATIVE NEGATIVE   RBC / HPF 0-5 0 - 5 RBC/hpf   WBC, UA 0-5 0 - 5 WBC/hpf   Bacteria, UA RARE (A) NONE SEEN   Squamous Epithelial / LPF 21-50 0 - 5   Mucus PRESENT    Imaging No results found.  MAU Course  Procedures Lab Orders         Comprehensive metabolic panel         Urinalysis, Routine w reflex microscopic Urine, Clean Catch    Meds ordered this encounter  Medications   lactated ringers  bolus 1,000 mL   promethazine (PHENERGAN) 25 mg in sodium chloride 0.9 % 50 mL IVPB   glycopyrrolate (ROBINUL) injection 0.1 mg   lactated ringers bolus 1,000 mL   promethazine (PHENERGAN) 12.5 MG tablet    Sig: Take 1-2 tablets (12.5-25 mg total) by mouth at bedtime as needed for nausea or vomiting.    Dispense:  30 tablet    Refill:  2    Order Specific Question:   Supervising Provider    Answer:   Alysia Penna, MICHAEL L [[id number]]   metoCLOPramide (REGLAN) 10 MG tablet    Sig: Take 1 tablet (10 mg total) by mouth 3 (three) times daily with meals as needed for nausea.    Dispense:  30 tablet    Refill:  2    Order Specific Question:   Supervising Provider    Answer:   Nettie Elm L [[id number]]   Imaging Orders  No imaging studies ordered today    MDM moderate  Assessment and Plan  Gail Sims is a 35 y.o. at [redacted]w[redacted]d by LMP with no significant PMHx who presents for emesis and nausea. Pt has persistent emesis with no evidence of chest pain but signs of ketonuria and mild hypokalemia likely suggestive of hyperemesis gravidarum vs GERD. Pt's lower abdominal pain likely due to constipation. Discussed with pt the various pharmacological option for her emesis  Plan:  #Hyperemesis Gravidarum -Cont. Nexium 20mg   -Cont. IV LR fluids   -Start IV Phenergan 25mg  solution -Start IV Robinul injection 0.1mg   -Consider scopolamine patch for at home  -Consider pepcid for at home -Cont. Oral Zofran 4mg    #Lower abdominal pain  -Obtain CBC, U/S, b-hCG, and ABO-Rh to rule out ectopic pregnancy  #Disposition  -Discharge today   , CNM 11/13/20 12:51 PM  Allergies as of 11/13/2020       Reactions   Pollen Extract Hives, Shortness Of Breath, Itching   Other Hives, Swelling   Pet Dander        Medication List     STOP taking these medications    Doxylamine-Pyridoxine 10-10 MG Tbec   meclizine 25 MG tablet Commonly known as: ANTIVERT       TAKE these  medications    albuterol 108 (90 Base) MCG/ACT inhaler Commonly known as: VENTOLIN HFA USE 2 PUFFS PO Q 4 TO 6 H PRN SOB AND WHEEZING   esomeprazole 20 MG capsule Commonly known as: NEXIUM Take 20 mg by mouth daily at 12 noon.   metoCLOPramide 10 MG tablet Commonly known as: Reglan Take 1 tablet (10 mg total) by mouth 3 (three) times daily with meals as needed for nausea.   ondansetron 4 MG tablet Commonly known as: ZOFRAN Take 4 mg by mouth every 8 (eight) hours as needed for nausea or vomiting.   promethazine 12.5 MG tablet Commonly known as: PHENERGAN Take 1-2 tablets (12.5-25 mg total) by mouth at bedtime as needed for nausea or vomiting.        CNM attestation:  I have seen and examined this patient and agree with above documentation in the medical's note.   Gail Sims is a 35 y.o. 03-31-1982 at [redacted]w[redacted]d reporting n/v, abdominal pain, and constipation   PE: Patient Vitals for the  past 24 hrs:  BP Temp Temp src Pulse Resp SpO2 Height Weight  11/13/20 0946 108/70 98.4 F (36.9 C) Oral 84 19 99 % 5\' 5"  (1.651 m) 60.5 kg   Gen: calm comfortable, NAD Resp: normal effort, no distress Heart: Regular rate Abd: Soft, NT, gravid, S=D  FHR: unable to auscultate FHT with doppler Bedside by Dr [place] with IUP, normal FHR  ROS, labs, PMH reviewed  Orders Placed This Encounter  Procedures   Comprehensive metabolic panel   Urinalysis, Routine w reflex microscopic Urine, Clean Catch   Insert peripheral IV   Discharge patient   Meds ordered this encounter  Medications   lactated ringers bolus 1,000 mL   promethazine (PHENERGAN) 25 mg in sodium chloride 0.9 % 50 mL IVPB   glycopyrrolate (ROBINUL) injection 0.1 mg   lactated ringers bolus 1,000 mL   promethazine (PHENERGAN) 12.5 MG tablet    Sig: Take 1-2 tablets (12.5-25 mg total) by mouth at bedtime as needed for nausea or vomiting.    Dispense:  30 tablet    Refill:  2    Order Specific Question:    Supervising Provider    Answer:   03-31-1982, MICHAEL L [[id number]]   metoCLOPramide (REGLAN) 10 MG tablet    Sig: Take 1 tablet (10 mg total) by mouth 3 (three) times daily with meals as needed for nausea.    Dispense:  30 tablet    Refill:  2    Order Specific Question:   Supervising Provider    Answer:   Alysia Penna, MICHAEL L [[id number]]    MDM Pt with improved n/v after IV fluids and antiemetics. Mild hypokalemia similar to MAU visit on 11/12/20.  IUP confirmed by bedside 11/14/20.  Pt with constipation with Zofran so change to medication regimen. Reglan TID at mealtimes, Phenergan Q HS PRN.  Phenergan can be placed rectally or vaginally if not tolerating oral medications.  Continue Nexium daily.  F/U with early prenatal care, list of providers given. Return precautions reviewed.   Assessment: 1. Nausea and vomiting during pregnancy prior to [redacted] weeks gestation     Plan: - Discharge home in stable condition.   [place], CNM 11/13/2020 12:51 PM

## 2020-11-13 NOTE — MAU Provider Note (Addendum)
[id number]   Arrival date and time: 11/13/20 0930        Chief Complaint  Patient presents with   Emesis   Nausea    HPI Gail Sims is a 35 y.o. at [redacted]w[redacted]d by LMP with no significant PMHx who presents for emesis and nausea. She had episodes 7-10x a day that started 2.5 weeks ago that is exacerbated by eating and the smell of certain foods. She has noticed mild bleeding after each episode in her emesis. She endorses dizziness. Pt reported abdominal pain to nurse. She has no chest pain. Pt went to the Urgent Care on Monday and was given the diagnosis of hyperemesis gravidarum. She was started on Zofran, Nexium, and Doxylamine. Stated Nexium was moderately helpful    She admitted to MAU on Tuesday for ongoing nausea but left without being seen.    Vaginal bleeding: No LOF: No Fetal Movement: No Contractions: No     OB History       Gravida  3   Para  2   Term      Preterm  2   AB      Living  2        SAB      IAB      Ectopic      Multiple      Live Births                      Past Medical History:  Diagnosis Date   Bronchitis     Seasonal allergies             Past Surgical History:  Procedure Laterality Date   CESAREAN SECTION               Family History  Problem Relation Age of Onset   Heart disease Mother     Cancer Mother     Cancer Maternal Grandfather        Social History         Socioeconomic History   Marital status: Married      Spouse name: Not on file   Number of children: Not on file   Years of education: Not on file   Highest education level: Not on file  Occupational History   Not on file  Tobacco Use   Smoking status: Never   Smokeless tobacco: Never  Substance and Sexual Activity   Alcohol use: Not Currently   Drug use: Not Currently      Types: Marijuana      Comment: last used marijuana when found out she was pregnant as of 11/13/2020   Sexual activity: Not Currently  Other Topics Concern   Not on  file  Social History Narrative   Not on file    Social Determinants of Health    Financial Resource Strain: Not on file  Food Insecurity: Not on file  Transportation Needs: Not on file  Physical Activity: Not on file  Stress: Not on file  Social Connections: Not on file  Intimate Partner Violence: Not on file           Allergies  Allergen Reactions   Pollen Extract Hives, Shortness Of Breath and Itching   Other Hives and Swelling      Pet Dander      No current facility-administered medications on file prior to encounter.          Current Outpatient Medications on File Prior to Encounter  Medication  Sig Dispense Refill   esomeprazole (NEXIUM) 20 MG capsule Take 20 mg by mouth daily at 12 noon.       ondansetron (ZOFRAN) 4 MG tablet Take 4 mg by mouth every 8 (eight) hours as needed for nausea or vomiting.       albuterol (PROVENTIL HFA;VENTOLIN HFA) 108 (90 Base) MCG/ACT inhaler USE 2 PUFFS PO Q 4 TO 6 H PRN SOB AND WHEEZING   0   Doxylamine-Pyridoxine 10-10 MG TBEC Take 1 tablet by mouth in the morning and at bedtime. 60 tablet 0   meclizine (ANTIVERT) 25 MG tablet Take 25 mg by mouth 3 (three) times daily as needed for dizziness.            Review of Systems  Constitutional:  Negative for fever.  Respiratory:  Positive for sputum production and shortness of breath.   Cardiovascular:  Negative for chest pain.  Gastrointestinal:  Positive for constipation, nausea and vomiting.  Neurological:  Positive for dizziness and headaches.      Physical Exam    BP 108/70 (BP Location: Right Arm)   Pulse 84   Temp 98.4 F (36.9 C) (Oral)   Resp 19   Ht 5\' 5"  (1.651 m)   Wt 60.5 kg   LMP 08/29/2020   SpO2 99%   BMI 22.18 kg/m    Patient Vitals for the past 24 hrs:   BP Temp Temp src Pulse Resp SpO2 Height Weight  11/13/20 0946 108/70 98.4 F (36.9 C) Oral 84 19 99 % 5\' 5"  (1.651 m) 60.5 kg      Physical Exam Constitutional:      Appearance: She is not  diaphoretic.  HENT:     Head: Normocephalic and atraumatic.  Pulmonary:     Effort: Pulmonary effort is normal.  Abdominal:     Tenderness: There is no abdominal tenderness.  Neurological:     Mental Status: She is alert.    Cervical Exam Not performed    Bedside Ultrasound Yes    My interpretation: Confirmed IUP with visible yolk sac and heart beat. GA [redacted]wk    Labs Lab Results Last 24 Hours       Results for orders placed or performed during the hospital encounter of 11/13/20 (from the past 24 hour(s))  Comprehensive metabolic panel     Status: Abnormal    Collection Time: 11/13/20 10:05 AM  Result Value Ref Range    Sodium 133 (L) 135 - 145 mmol/L    Potassium 3.2 (L) 3.5 - 5.1 mmol/L    Chloride 101 98 - 111 mmol/L    CO2 21 (L) 22 - 32 mmol/L    Glucose, Bld 88 70 - 99 mg/dL    BUN 9 6 - 20 mg/dL    Creatinine, Ser 11/15/20 0.44 - 1.00 mg/dL    Calcium 9.3 8.9 - 11/15/20 mg/dL    Total Protein 7.2 6.5 - 8.1 g/dL    Albumin 4.1 3.5 - 5.0 g/dL    AST 20 15 - 41 U/L    ALT 14 0 - 44 U/L    Alkaline Phosphatase 44 38 - 126 U/L    Total Bilirubin 1.0 0.3 - 1.2 mg/dL    GFR, Estimated 3.29 51.8 mL/min    Anion gap 11 5 - 15  Urinalysis, Routine w reflex microscopic Urine, Clean Catch     Status: Abnormal    Collection Time: 11/13/20 10:47 AM  Result Value Ref Range    Color, Urine  AMBER (A) YELLOW    APPearance CLOUDY (A) CLEAR    Specific Gravity, Urine 1.035 (H) 1.005 - 1.030    pH 5.0 5.0 - 8.0    Glucose, UA NEGATIVE NEGATIVE mg/dL    Hgb urine dipstick NEGATIVE NEGATIVE    Bilirubin Urine NEGATIVE NEGATIVE    Ketones, ur 80 (A) NEGATIVE mg/dL    Protein, ur 30 (A) NEGATIVE mg/dL    Nitrite NEGATIVE NEGATIVE    Leukocytes,Ua NEGATIVE NEGATIVE    RBC / HPF 0-5 0 - 5 RBC/hpf    WBC, UA 0-5 0 - 5 WBC/hpf    Bacteria, UA RARE (A) NONE SEEN    Squamous Epithelial / LPF 21-50 0 - 5    Mucus PRESENT        Imaging No results found.   MAU Course  Procedures Lab  Orders         Comprehensive metabolic panel         Urinalysis, Routine w reflex microscopic Urine, Clean Catch         Meds ordered this encounter  Medications   lactated ringers bolus 1,000 mL   promethazine (PHENERGAN) 25 mg in sodium chloride 0.9 % 50 mL IVPB   glycopyrrolate (ROBINUL) injection 0.1 mg   lactated ringers bolus 1,000 mL   promethazine (PHENERGAN) 12.5 MG tablet      Sig: Take 1-2 tablets (12.5-25 mg total) by mouth at bedtime as needed for nausea or vomiting.      Dispense:  30 tablet      Refill:  2      Order Specific Question:   Supervising Provider      Answer:   Alysia Penna, MICHAEL L [[id number]]   metoCLOPramide (REGLAN) 10 MG tablet      Sig: Take 1 tablet (10 mg total) by mouth 3 (three) times daily with meals as needed for nausea.      Dispense:  30 tablet      Refill:  2      Order Specific Question:   Supervising Provider      Answer:   Nettie Elm L [[id number]]    Imaging Orders  No imaging studies ordered today      MDM moderate   Assessment and Plan  Ragen Herringshaw is a 35 y.o. at [redacted]w[redacted]d by LMP with no significant PMHx who presents for emesis and nausea. Pt has persistent emesis with no evidence of chest pain but signs of ketonuria and mild hypokalemia likely suggestive of hyperemesis gravidarum vs GERD. Pt's lower abdominal pain likely due to constipation. Discussed with pt the various pharmacological option for her emesis   Plan:   #Hyperemesis Gravidarum -Cont. Nexium 20mg   -Cont. IV LR fluids   -Start IV Phenergan 25mg  solution -Start IV Robinul injection 0.1mg   -Consider scopolamine patch for at home  -Consider pepcid for at home -Cont. Oral Zofran 4mg     #Lower abdominal pain  -Obtain CBC, U/S, b-hCG, and ABO-Rh to rule out ectopic pregnancy   #Disposition  -Discharge today    , MS3 11/13/20 12:51 PM   Allergies as of 11/13/2020         Reactions    Pollen Extract Hives, Shortness Of Breath, Itching    Other  Hives, Swelling    Pet Dander            Medication List       STOP taking these medications     Doxylamine-Pyridoxine 10-10 MG Tbec  meclizine 25 MG tablet Commonly known as: ANTIVERT           TAKE these medications     albuterol 108 (90 Base) MCG/ACT inhaler Commonly known as: VENTOLIN HFA USE 2 PUFFS PO Q 4 TO 6 H PRN SOB AND WHEEZING    esomeprazole 20 MG capsule Commonly known as: NEXIUM Take 20 mg by mouth daily at 12 noon.    metoCLOPramide 10 MG tablet Commonly known as: Reglan Take 1 tablet (10 mg total) by mouth 3 (three) times daily with meals as needed for nausea.    ondansetron 4 MG tablet Commonly known as: ZOFRAN Take 4 mg by mouth every 8 (eight) hours as needed for nausea or vomiting.    promethazine 12.5 MG tablet Commonly known as: PHENERGAN Take 1-2 tablets (12.5-25 mg total) by mouth at bedtime as needed for nausea or vomiting.           CNM attestation:   I have seen and examined this patient and agree with above documentation in the medical's note.    Belenda Wohlwend is a 35 y.o. [id number] at [redacted]w[redacted]d reporting n/v, abdominal pain, and constipation     PE: Patient Vitals for the past 24 hrs:   BP Temp Temp src Pulse Resp SpO2 Height Weight  11/13/20 0946 108/70 98.4 F (36.9 C) Oral 84 19 99 % 5\' 5"  (1.651 m) 60.5 kg    Gen: calm comfortable, NAD Resp: normal effort, no distress Heart: Regular rate Abd: Soft, NT, gravid, S=D   FHR: unable to auscultate FHT with doppler Bedside US by Dr Crissie Reese with IUP, normal FHR   ROS, labs, PMH reviewed      Orders Placed This Encounter  Procedures   Comprehensive metabolic panel   Urinalysis, Routine w reflex microscopic Urine, Clean Catch   Insert peripheral IV   Discharge patient         Meds ordered this encounter  Medications   lactated ringers bolus 1,000 mL   promethazine (PHENERGAN) 25 mg in sodium chloride 0.9 % 50 mL IVPB   glycopyrrolate (ROBINUL) injection 0.1 mg    lactated ringers bolus 1,000 mL   promethazine (PHENERGAN) 12.5 MG tablet      Sig: Take 1-2 tablets (12.5-25 mg total) by mouth at bedtime as needed for nausea or vomiting.      Dispense:  30 tablet      Refill:  2      Order Specific Question:   Supervising Provider      Answer:   Alysia Penna, MICHAEL L [[id number]]   metoCLOPramide (REGLAN) 10 MG tablet      Sig: Take 1 tablet (10 mg total) by mouth 3 (three) times daily with meals as needed for nausea.      Dispense:  30 tablet      Refill:  2      Order Specific Question:   Supervising Provider      Answer:   Alysia Penna, MICHAEL L [[id number]]      MDM Pt with improved n/v after IV fluids and antiemetics. Mild hypokalemia similar to MAU visit on 11/12/20.  IUP confirmed by bedside US.  Pt with constipation with Zofran so change to medication regimen. Reglan TID at mealtimes, Phenergan Q HS PRN.  Phenergan can be placed rectally or vaginally if not tolerating oral medications.  Continue Nexium daily.  F/U with early prenatal care, list of providers given. Return precautions reviewed.    Assessment: 1. Nausea and vomiting during pregnancy  prior to [redacted] weeks gestation       Plan: - Discharge home in stable condition.     Kendell Bane 11/13/2020 12:51 PM      Revision History

## 2020-11-13 NOTE — MAU Note (Signed)
Presents stating she has N/V, unable to keep anything down.  Seen @ urgent care and diagnosed with "HG".  Taking Zofran, reports it's not working.   Denies VB, endorses cramping.

## 2020-11-13 NOTE — MAU Note (Signed)
Fleets enema successfully administered. Patient tolerated well.

## 2020-11-20 ENCOUNTER — Inpatient Hospital Stay (HOSPITAL_COMMUNITY): Payer: Medicaid Other

## 2020-11-20 ENCOUNTER — Other Ambulatory Visit: Payer: Self-pay

## 2020-11-20 ENCOUNTER — Inpatient Hospital Stay (HOSPITAL_COMMUNITY)
Admission: AD | Admit: 2020-11-20 | Discharge: 2020-11-20 | Disposition: A | Discharge status: Home / Self Care | Payer: Medicaid Other | Attending: Obstetrics & Gynecology | Admitting: Obstetrics & Gynecology

## 2020-11-20 ENCOUNTER — Encounter (HOSPITAL_COMMUNITY): Payer: Self-pay | Admitting: Obstetrics & Gynecology

## 2020-11-20 DIAGNOSIS — O039 Complete or unspecified spontaneous abortion without complication: Secondary | ICD-10-CM | POA: Diagnosis not present

## 2020-11-20 DIAGNOSIS — O99611 Diseases of the digestive system complicating pregnancy, first trimester: Secondary | ICD-10-CM | POA: Diagnosis not present

## 2020-11-20 DIAGNOSIS — N8500 Endometrial hyperplasia, unspecified: Secondary | ICD-10-CM | POA: Diagnosis not present

## 2020-11-20 DIAGNOSIS — E860 Dehydration: Secondary | ICD-10-CM | POA: Insufficient documentation

## 2020-11-20 DIAGNOSIS — Z3A01 Less than 8 weeks gestation of pregnancy: Secondary | ICD-10-CM | POA: Insufficient documentation

## 2020-11-20 DIAGNOSIS — O209 Hemorrhage in early pregnancy, unspecified: Secondary | ICD-10-CM

## 2020-11-20 DIAGNOSIS — K5900 Constipation, unspecified: Secondary | ICD-10-CM | POA: Diagnosis not present

## 2020-11-20 DIAGNOSIS — O219 Vomiting of pregnancy, unspecified: Secondary | ICD-10-CM | POA: Insufficient documentation

## 2020-11-20 DIAGNOSIS — O034 Incomplete spontaneous abortion without complication: Secondary | ICD-10-CM

## 2020-11-20 DIAGNOSIS — R9389 Abnormal findings on diagnostic imaging of other specified body structures: Secondary | ICD-10-CM | POA: Diagnosis not present

## 2020-11-20 DIAGNOSIS — R102 Pelvic and perineal pain: Secondary | ICD-10-CM | POA: Insufficient documentation

## 2020-11-20 DIAGNOSIS — R109 Unspecified abdominal pain: Secondary | ICD-10-CM | POA: Diagnosis not present

## 2020-11-20 DIAGNOSIS — Z3A11 11 weeks gestation of pregnancy: Secondary | ICD-10-CM | POA: Diagnosis not present

## 2020-11-20 DIAGNOSIS — R112 Nausea with vomiting, unspecified: Secondary | ICD-10-CM | POA: Diagnosis not present

## 2020-11-20 LAB — URINALYSIS, ROUTINE W REFLEX MICROSCOPIC
Glucose, UA: NEGATIVE mg/dL
Ketones, ur: >80 mg/dL — AB
Nitrite: NEGATIVE
Protein, ur: NEGATIVE mg/dL
Specific Gravity, Urine: 1.025 (ref 1.005–1.030)
pH: 6 (ref 5.0–8.0)

## 2020-11-20 LAB — URINALYSIS, MICROSCOPIC (REFLEX): RBC / HPF: >50 RBC/hpf (ref 0–5)

## 2020-11-20 MED ORDER — KETOROLAC TROMETHAMINE 10 MG PO TABS: 10.0000 mg | ORAL_TABLET | Freq: Four times a day (QID) | ORAL | 0 refills | Status: DC | PRN

## 2020-11-20 MED ORDER — KCL-LACTATED RINGERS-D5W 20 MEQ/L IV SOLN: Freq: Once | INTRAVENOUS | Status: DC

## 2020-11-20 MED ORDER — OXYCODONE-ACETAMINOPHEN 5-325 MG PO TABS: 1.0000 | ORAL_TABLET | Freq: Four times a day (QID) | ORAL | 0 refills | Status: DC | PRN

## 2020-11-20 MED ORDER — POLYETHYLENE GLYCOL 3350 17 G PO PACK: 17.0000 g | PACK | Freq: Every day | ORAL | 0 refills | Status: AC

## 2020-11-20 MED ORDER — MISOPROSTOL 200 MCG PO TABS: 800.0000 ug | ORAL_TABLET | Freq: Once | ORAL | 0 refills | Status: DC

## 2020-11-20 MED ORDER — LACTATED RINGERS IV SOLN: Freq: Once | INTRAVENOUS | Status: AC

## 2020-11-20 MED ORDER — SODIUM CHLORIDE 0.9 % IV SOLN: 12.5000 mg | Freq: Once | INTRAVENOUS | Status: DC

## 2020-11-20 MED ORDER — MISOPROSTOL 200 MCG PO TABS
800.0000 ug | ORAL_TABLET | Freq: Once | ORAL | Status: AC
  Administered 2020-11-20: 800 ug via ORAL
  Filled 2020-11-20: qty 4

## 2020-11-20 MED ORDER — KCL-LACTATED RINGERS-D5W 20 MEQ/L IV SOLN
Freq: Once | INTRAVENOUS | Status: DC
  Filled 2020-11-20: qty 1000

## 2020-11-20 MED ORDER — LACTATED RINGERS IV SOLN: Freq: Once | INTRAVENOUS | Status: DC

## 2020-11-20 MED ORDER — KETOROLAC TROMETHAMINE 30 MG/ML IJ SOLN
30.0000 mg | Freq: Once | INTRAMUSCULAR | Status: AC
  Administered 2020-11-20: 30 mg via INTRAVENOUS
  Filled 2020-11-20: qty 1

## 2020-11-20 MED ORDER — FENTANYL CITRATE (PF) 100 MCG/2ML IJ SOLN
100.0000 ug | Freq: Once | INTRAMUSCULAR | Status: AC
  Administered 2020-11-20: 100 ug via INTRAVENOUS
  Filled 2020-11-20: qty 2

## 2020-11-20 NOTE — MAU Note (Signed)
Felt like had to have BM about 0130 and went to [facility]. Saw a lot of vaginal bleeding with a clot in toilet. Afraid may have had a miscariage. Also having a lot of abdominal cramping. Has not had BM since was here and had an enema  - about 10days or so per pt

## 2020-11-20 NOTE — MAU Provider Note (Addendum)
Chief Complaint: No chief complaint on file.   Event Date/Time   First Provider Initiated Contact with Patient 11/20/20 0222        SUBJECTIVE HPI: Gail Sims is a 35 y.o. [id number] at [redacted]w[redacted]d by LMP who presents to maternity admissions reporting vaginal bleeding, passage of a large clot, pelvic cramping, and nausea and vomiting.  Has had 10 days of constipation.  Was seen on 11/13/20 for vomiting and Dr Crissie Reese saw a live 6 week fetus on bedside US. Marland Kitchen She denies h/a, dizziness, or fever/chills.    Vaginal Bleeding The patient's primary symptoms include pelvic pain and vaginal bleeding. The patient's pertinent negatives include no genital itching, genital lesions or genital odor. This is a new problem. The current episode started today. The pain is moderate. The problem affects both sides. She is pregnant. Associated symptoms include abdominal pain and constipation. Pertinent negatives include no diarrhea, dysuria, fever or headaches. The vaginal discharge was bloody. The vaginal bleeding is heavier than menses. She has been passing clots. She has tried nothing for the symptoms.   RN Note: Felt like had to have BM about 0130 and went to [facility]. Saw a lot of vaginal bleeding with a clot in toilet. Afraid may have had a miscariage. Also having a lot of abdominal cramping. Has not had BM since was here and had an enema  - about 10days or so per pt  Past Medical History:  Diagnosis Date   Bronchitis    Seasonal allergies    Past Surgical History:  Procedure Laterality Date   CESAREAN SECTION     Social History   Socioeconomic History   Marital status: Married    Spouse name: Not on file   Number of children: Not on file   Years of education: Not on file   Highest education level: Not on file  Occupational History   Not on file  Tobacco Use   Smoking status: Never   Smokeless tobacco: Never  Substance and Sexual Activity   Alcohol use: Not Currently   Drug use: Not Currently     Types: Marijuana    Comment: last used marijuana when found out she was pregnant as of 11/13/2020   Sexual activity: Not Currently  Other Topics Concern   Not on file  Social History Narrative   Not on file   Social Determinants of Health   Financial Resource Strain: Not on file  Food Insecurity: Not on file  Transportation Needs: Not on file  Physical Activity: Not on file  Stress: Not on file  Social Connections: Not on file  Intimate Partner Violence: Not on file   No current facility-administered medications on file prior to encounter.   Current Outpatient Medications on File Prior to Encounter  Medication Sig Dispense Refill   albuterol (PROVENTIL HFA;VENTOLIN HFA) 108 (90 Base) MCG/ACT inhaler USE 2 PUFFS PO Q 4 TO 6 H PRN SOB AND WHEEZING  0   esomeprazole (NEXIUM) 20 MG capsule Take 20 mg by mouth daily at 12 noon.     glycopyrrolate (ROBINUL) 2 MG tablet Take 1 tablet (2 mg total) by mouth 3 (three) times daily as needed. 30 tablet 3   metoCLOPramide (REGLAN) 10 MG tablet Take 1 tablet (10 mg total) by mouth 3 (three) times daily with meals as needed for nausea. 30 tablet 2   ondansetron (ZOFRAN) 4 MG tablet Take 4 mg by mouth every 8 (eight) hours as needed for nausea or vomiting.  promethazine (PHENERGAN) 12.5 MG tablet Take 1-2 tablets (12.5-25 mg total) by mouth at bedtime as needed for nausea or vomiting. 30 tablet 2   Allergies  Allergen Reactions   Pollen Extract Hives, Shortness Of Breath and Itching   Other Hives and Swelling    Pet Dander    I have reviewed patient's Past Medical Hx, Surgical Hx, Family Hx, Social Hx, medications and allergies.   ROS:  Review of Systems  Constitutional:  Negative for fever.  Gastrointestinal:  Positive for abdominal pain and constipation. Negative for diarrhea.  Genitourinary:  Positive for pelvic pain and vaginal bleeding. Negative for dysuria.  Neurological:  Negative for headaches.  Review of Systems  Other systems  negative   Physical Exam  Physical Exam Patient Vitals for the past 24 hrs:  BP Temp Pulse Resp SpO2 Height Weight  11/20/20 0213 120/63 -- 99 -- 98 % -- --  11/20/20 0211 -- 99.9 F (37.7 C) -- 16 -- 5\' 5"  (1.651 m) 59.4 kg   Constitutional: Well-developed, well-nourished female in no acute distress.  Cardiovascular: normal rate Respiratory: normal effort GI: Abd soft, non-tender. Pos BS x 4 MS: Extremities nontender, no edema, normal ROM Neurologic: Alert and oriented x 4.  GU: Neg CVAT.  PELVIC EXAM: vaginal walls and external genitalia normal, Moderate amount of dark red blood in vault  LAB RESULTS Labs ordered but patient refuses to have any blood drawn for testing due to religious reasons.    IMAGING OB LESS THAN 14 WEEKS WITH OB TRANSVAGINAL  Result Date: 11/20/2020 CLINICAL DATA:  35 year old pregnant female with vaginal bleeding. LMP: 08/29/2020 corresponding to an estimated gestational age [redacted] weeks, 6 days. EXAM: OBSTETRIC <14 WK 4 weeks AND TRANSVAGINAL OB US TECHNIQUE: Both transabdominal and transvaginal ultrasound examinations were performed for complete evaluation of the gestation as well as the maternal uterus, adnexal regions, and pelvic cul-de-sac. Transvaginal technique was performed to assess early pregnancy. COMPARISON:  None. FINDINGS: The uterus is anteverted and appears unremarkable. The endometrium is thickened and heterogeneous measuring up to approximately 3 cm. There is some vascularity within the upper endometrium. No intrauterine pregnancy identified. Although findings are most consistent with miscarriage, in the absence of previously documented IUP by ultrasound, the differential diagnosis includes recent spontaneous miscarriage, early pregnancy, or an occult ectopic pregnancy. Heterogeneous endometrial content likely represent blood products or clot. However, in the presence of vascularity, retained product of conception is not excluded. Correlation with  clinical exam and follow-up with serial hCG levels recommended. The right ovary is unremarkable.  The left ovary is not visualized. No significant free fluid in the pelvis. IMPRESSION: No intrauterine pregnancy identified. Findings most consistent with recent spontaneous miscarriage with large blood product or clot within the endometrium. Retained product of conception is not excluded. Correlation with clinical exam and follow-up with serial HCG levels recommended. Electronically Signed   By: [place] M.D.   On: 11/20/2020 03:21   Dr 11/22/2020 had done an Crissie Reese on 11/13/20 documenting a live IUP at 6 wks  MAU Management/MDM: Ordered CBC and BMET, patient refused to have blood drawn Ordered ultrasound to evaluate status of pregnancy.  11/15/20 showed fetus is no longer present in uterus.  There was residual tissue in fundus, possible retained POC  Consult Dr [place] with presentation, exam findings, and results.  He recommends Cytotec here and repeat at home.  We gave Despina Hidden here and also Fentanyl and Toradol for pain She felt better with IV fluids and meds Discussed  results and plan of care  Will discharge home with Cytotec at home and Rx for Percocet and Toradol for pain Has rx for antinausea meds.   Will followup with [place] Monday and PP eval 2-3 wks.    ASSESSMENT Pregnancy at 7wks, s/p spontaneous abortion Possible retained products in uterus Nausea and vomiting Dehydration, treated Constipation  PLAN Discharge home Message sent for repeat US Monday to eval for completion of passage of tissue Message sent for SAB followup in 2-3 wks Rx Toradol po prn pain Rx Cytotec to be taken this afternoon Rx Percocet for pain Rx Miralax for constipation  Pt stable at time of discharge. Encouraged to return here if she develops worsening of symptoms, increase in pain, fever, or other concerning symptoms.    Wynelle Bourgeois CNM, MSN Certified Nurse-Midwife 11/20/2020  2:22 AM

## 2020-11-29 DIAGNOSIS — Z419 Encounter for procedure for purposes other than remedying health state, unspecified: Secondary | ICD-10-CM | POA: Diagnosis not present

## 2020-12-09 ENCOUNTER — Ambulatory Visit: Payer: Medicaid Other | Admitting: Family Medicine

## 2020-12-30 DIAGNOSIS — Z419 Encounter for procedure for purposes other than remedying health state, unspecified: Secondary | ICD-10-CM | POA: Diagnosis not present

## 2021-01-29 DIAGNOSIS — Z419 Encounter for procedure for purposes other than remedying health state, unspecified: Secondary | ICD-10-CM | POA: Diagnosis not present

## 2021-03-01 DIAGNOSIS — Z419 Encounter for procedure for purposes other than remedying health state, unspecified: Secondary | ICD-10-CM | POA: Diagnosis not present

## 2021-03-01 NOTE — L&D Delivery Note (Signed)
Delivery Note   Pt reached complete dilation and pushed very well for about 10 minutes.  At 10:45 AM a viable female was delivered via Vaginal, Spontaneous (Presentation: Right Occiput Anterior).  APGAR: 9, 9; weight  pending.   Placenta status: Spontaneous, Intact.  Cord: 3 vessels with the following complications: None.  Parents to take the placenta home  Anesthesia: None Episiotomy: None Lacerations:  None  Est. Blood Loss (mL): 475mL  Pt given TXA and methergine proactively given baseline anemia and that she will not accept blood products of any kind   Mom to postpartum.  Baby to Couplet care / Skin to Skin.  D/w parents circumcision and they desire  Logan Bores 11/28/2021, 12:02 PM

## 2021-04-01 DIAGNOSIS — Z419 Encounter for procedure for purposes other than remedying health state, unspecified: Secondary | ICD-10-CM | POA: Diagnosis not present

## 2021-04-19 ENCOUNTER — Inpatient Hospital Stay (HOSPITAL_COMMUNITY)
Admission: AD | Admit: 2021-04-19 | Discharge: 2021-04-19 | Disposition: A | Discharge status: Home / Self Care | Payer: Medicaid Other | Attending: Obstetrics and Gynecology | Admitting: Obstetrics and Gynecology

## 2021-04-19 ENCOUNTER — Other Ambulatory Visit: Payer: Self-pay

## 2021-04-19 ENCOUNTER — Encounter (HOSPITAL_COMMUNITY): Payer: Self-pay | Admitting: Obstetrics and Gynecology

## 2021-04-19 DIAGNOSIS — O219 Vomiting of pregnancy, unspecified: Secondary | ICD-10-CM | POA: Insufficient documentation

## 2021-04-19 DIAGNOSIS — O26891 Other specified pregnancy related conditions, first trimester: Secondary | ICD-10-CM | POA: Diagnosis not present

## 2021-04-19 DIAGNOSIS — Z3A08 8 weeks gestation of pregnancy: Secondary | ICD-10-CM

## 2021-04-19 LAB — POCT PREGNANCY, URINE: Preg Test, Ur: POSITIVE — AB

## 2021-04-19 LAB — URINALYSIS, ROUTINE W REFLEX MICROSCOPIC
Bilirubin Urine: NEGATIVE
Glucose, UA: NEGATIVE mg/dL
Hgb urine dipstick: NEGATIVE
Ketones, ur: >80 mg/dL — AB
Leukocytes,Ua: NEGATIVE
Nitrite: NEGATIVE
Protein, ur: NEGATIVE mg/dL
Specific Gravity, Urine: >1.03 — ABNORMAL HIGH (ref 1.005–1.030)
pH: 6 (ref 5.0–8.0)

## 2021-04-19 MED ORDER — ONDANSETRON HCL 4 MG/2ML IJ SOLN
4.0000 mg | Freq: Once | INTRAMUSCULAR | Status: AC
  Administered 2021-04-19: 4 mg via INTRAVENOUS
  Filled 2021-04-19: qty 2

## 2021-04-19 MED ORDER — PROMETHAZINE HCL 12.5 MG PO TABS: 12.5000 mg | ORAL_TABLET | Freq: Every evening | ORAL | 1 refills | Status: DC | PRN

## 2021-04-19 MED ORDER — METOCLOPRAMIDE HCL 10 MG PO TABS: 10.0000 mg | ORAL_TABLET | Freq: Three times a day (TID) | ORAL | 1 refills | Status: DC | PRN

## 2021-04-19 MED ORDER — LACTATED RINGERS IV BOLUS
1000.0000 mL | Freq: Once | INTRAVENOUS | Status: AC
  Administered 2021-04-19: 1000 mL via INTRAVENOUS

## 2021-04-19 MED ORDER — FAMOTIDINE IN NACL 20-0.9 MG/50ML-% IV SOLN
20.0000 mg | Freq: Once | INTRAVENOUS | Status: AC
  Administered 2021-04-19: 20 mg via INTRAVENOUS
  Filled 2021-04-19: qty 50

## 2021-04-19 MED ORDER — SCOPOLAMINE 1 MG/3DAYS TD PT72
1.0000 | MEDICATED_PATCH | TRANSDERMAL | Status: DC
  Administered 2021-04-19: 1.5 mg via TRANSDERMAL
  Filled 2021-04-19: qty 1

## 2021-04-19 MED ORDER — ONDANSETRON 4 MG PO TBDP: 4.0000 mg | ORAL_TABLET | Freq: Three times a day (TID) | ORAL | 1 refills | Status: DC | PRN

## 2021-04-19 MED ORDER — GLYCOPYRROLATE 0.2 MG/ML IJ SOLN
0.1000 mg | Freq: Once | INTRAMUSCULAR | Status: AC
  Administered 2021-04-19: 0.1 mg via INTRAVENOUS
  Filled 2021-04-19: qty 1

## 2021-04-19 MED ORDER — SCOPOLAMINE 1 MG/3DAYS TD PT72: 1.0000 | MEDICATED_PATCH | TRANSDERMAL | 12 refills | Status: DC

## 2021-04-19 MED ORDER — GLYCOPYRROLATE 2 MG PO TABS: 2.0000 mg | ORAL_TABLET | Freq: Three times a day (TID) | ORAL | 2 refills | Status: DC | PRN

## 2021-04-19 NOTE — Discharge Instructions (Signed)
Prenatal Care Providers           [facility]  [address] [phone]  [facility]   [address]  [phone]  [facility] [place]       [address] [phone]            [facility] @      [address] [phone]          [facility] [place]   [address] [phone]  [facility]  [address] [phone]     [facility]  [address]   [phone]     [facility]  Phone: [phone]  [facility]  Phone: [phone]  [facility] Phone: [phone]  [facility] Phone: [phone]  [facility] Phone: [phone]  [facility] Phone: [phone]  [facility]  Phone: [phone] Safe Medications in Pregnancy   Acne: Benzoyl Peroxide Salicylic Acid  Backache/Headache: Tylenol: 2 regular strength every 4 hours OR              2 Extra strength every 6 hours  Colds/Coughs/Allergies: Benadryl (alcohol free) 25 mg every 6 hours as needed Breath right strips Claritin Cepacol throat lozenges Chloraseptic throat spray Cold-Eeze- up to three times per day Cough drops, alcohol free Flonase (by prescription only) Guaifenesin Mucinex Robitussin DM (plain only, alcohol free) Saline nasal spray/drops Sudafed (pseudoephedrine) & Actifed ** use only after [redacted] weeks gestation and if you do not have high blood pressure Tylenol Vicks Vaporub Zinc lozenges Zyrtec   Constipation: Colace Ducolax suppositories Fleet enema Glycerin suppositories Metamucil Milk of magnesia Miralax Senokot Smooth move tea  Diarrhea: Kaopectate Imodium A-D  *NO pepto  Bismol  Hemorrhoids: Anusol Anusol HC Preparation H Tucks  Indigestion: Tums Maalox Mylanta Zantac  Pepcid  Insomnia: Benadryl (alcohol free) 25mg every 6 hours as needed Tylenol PM Unisom, no Gelcaps  Leg Cramps: Tums MagGel  Nausea/Vomiting:  Bonine Dramamine Emetrol Ginger extract Sea bands Meclizine  Nausea medication to take during pregnancy:  Unisom (doxylamine succinate 25 mg tablets) Take one tablet daily at bedtime. If symptoms are not adequately controlled, the dose can be increased to a maximum recommended dose of two tablets daily (1/2 tablet in the morning, 1/2 tablet mid-afternoon and one at bedtime). Vitamin B6 100mg tablets. Take one tablet twice a day (up to 200 mg per day).  Skin Rashes: Aveeno products Benadryl cream or 25mg every 6 hours as needed Calamine Lotion 1% cortisone cream  Yeast infection: Gyne-lotrimin 7 Monistat 7   **If taking multiple medications, please check labels to avoid duplicating the same active ingredients **take medication as directed on the label ** Do not exceed 4000 mg of tylenol in 24 hours **Do not take medications that contain aspirin or ibuprofen    

## 2021-04-19 NOTE — MAU Provider Note (Signed)
History     CSN: [id number]  Arrival date and time: 04/19/21 1108   Event Date/Time   First Provider Initiated Contact with Patient 04/19/21 1315      Chief Complaint  Patient presents with   Abdominal Pain   Nausea   Possible Pregnancy   Emesis   HPI Gail Sims is a 36 y.o. [id number] at [redacted]w[redacted]d who presents with nausea and vomiting. She reports a positive home pregnancy test and since then, she has struggled with nausea and vomiting. She does not have any antiemetics to try. She also reports some spitting. She reports upper abdominal pain when she vomits. Denies any pain at this time.   OB History     Gravida  5   Para  2   Term      Preterm  2   AB  1   Living  2      SAB  1   IAB      Ectopic      Multiple      Live Births              Past Medical History:  Diagnosis Date   Bronchitis    Seasonal allergies     Past Surgical History:  Procedure Laterality Date   CESAREAN SECTION      Family History  Problem Relation Age of Onset   Heart disease Mother    Cancer Mother    Cancer Maternal Grandfather     Social History   Tobacco Use   Smoking status: Never   Smokeless tobacco: Never  Substance Use Topics   Alcohol use: Not Currently   Drug use: Not Currently    Types: Marijuana    Comment: last used marijuana when found out she was pregnant as of 11/13/2020    Allergies:  Allergies  Allergen Reactions   Pollen Extract Hives, Shortness Of Breath and Itching   Other Hives and Swelling    Pet Dander    Medications Prior to Admission  Medication Sig Dispense Refill Last Dose   glycopyrrolate (ROBINUL) 2 MG tablet Take 1 tablet (2 mg total) by mouth 3 (three) times daily as needed. 30 tablet 3 04/18/2021   metoCLOPramide (REGLAN) 10 MG tablet Take 1 tablet (10 mg total) by mouth 3 (three) times daily with meals as needed for nausea. 30 tablet 2 Past Week   albuterol (PROVENTIL HFA;VENTOLIN HFA) 108 (90 Base) MCG/ACT inhaler  USE 2 PUFFS PO Q 4 TO 6 H PRN SOB AND WHEEZING  0    esomeprazole (NEXIUM) 20 MG capsule Take 20 mg by mouth daily at 12 noon.      ketorolac (TORADOL) 10 MG tablet Take 1 tablet (10 mg total) by mouth every 6 (six) hours as needed for moderate pain. 10 tablet 0    misoprostol (CYTOTEC) 200 MCG tablet Take 4 tablets (800 mcg total) by mouth once for 1 dose. Later today 4 tablet 0    ondansetron (ZOFRAN) 4 MG tablet Take 4 mg by mouth every 8 (eight) hours as needed for nausea or vomiting.      ondansetron (ZOFRAN-ODT) 4 MG disintegrating tablet Take 4 mg by mouth.      oxyCODONE-acetaminophen (PERCOCET/ROXICET) 5-325 MG tablet Take 1 tablet by mouth every 6 (six) hours as needed for moderate pain. 15 tablet 0    polyethylene glycol (MIRALAX) 17 g packet Take 17 g by mouth daily. 14 each 0    promethazine (PHENERGAN) 12.5 MG tablet  Take 1-2 tablets (12.5-25 mg total) by mouth at bedtime as needed for nausea or vomiting. 30 tablet 2     Review of Systems Physical Exam   Blood pressure (!) 118/56, pulse 94, temperature 99.2 F (37.3 C), temperature source Oral, resp. rate 16, height 5\' 5"  (1.651 m), weight 62 kg, last menstrual period 02/22/2021, SpO2 98 %, unknown if currently breastfeeding.  Physical Exam  MAU Course  Procedures Results for orders placed or performed during the hospital encounter of 04/19/21 (from the past 24 hour(s))  Pregnancy, urine POC     Status: Abnormal   Collection Time: 04/19/21 12:12 PM  Result Value Ref Range   Preg Test, Ur POSITIVE (A) NEGATIVE  Urinalysis, Routine w reflex microscopic Urine, Clean Catch     Status: Abnormal   Collection Time: 04/19/21 12:14 PM  Result Value Ref Range   Color, Urine YELLOW YELLOW   APPearance CLEAR CLEAR   Specific Gravity, Urine >1.030 (H) 1.005 - 1.030   pH 6.0 5.0 - 8.0   Glucose, UA NEGATIVE NEGATIVE mg/dL   Hgb urine dipstick NEGATIVE NEGATIVE   Bilirubin Urine NEGATIVE NEGATIVE   Ketones, ur >80 (A) NEGATIVE  mg/dL   Protein, ur NEGATIVE NEGATIVE mg/dL   Nitrite NEGATIVE NEGATIVE   Leukocytes,Ua NEGATIVE NEGATIVE    MDM UA LR bolus Pepcid Zofran Scop patches Robinul  Assessment and Plan   1. Nausea/vomiting in pregnancy   2. [redacted] weeks gestation of pregnancy    -Discharge home in stable condition -Rx for zofran, pepcid, phenergan, reglan and scop patches  -First trimester precautions discussed -Patient advised to follow-up with OB to establish prenatal care -Patient may return to MAU as needed or if her condition were to change or worsen   04/21/21 CNM 04/19/2021, 1:15 PM

## 2021-04-19 NOTE — MAU Note (Signed)
Pt reports to mau with c/o nausea, vomiting and upper abd pain after 3 pos hpt.  Denies vag bleeding.

## 2021-04-29 DIAGNOSIS — Z419 Encounter for procedure for purposes other than remedying health state, unspecified: Secondary | ICD-10-CM | POA: Diagnosis not present

## 2021-05-30 DIAGNOSIS — Z419 Encounter for procedure for purposes other than remedying health state, unspecified: Secondary | ICD-10-CM | POA: Diagnosis not present

## 2021-06-16 DIAGNOSIS — O26841 Uterine size-date discrepancy, first trimester: Secondary | ICD-10-CM | POA: Diagnosis not present

## 2021-06-16 DIAGNOSIS — Z3A16 16 weeks gestation of pregnancy: Secondary | ICD-10-CM | POA: Diagnosis not present

## 2021-06-16 DIAGNOSIS — Z113 Encounter for screening for infections with a predominantly sexual mode of transmission: Secondary | ICD-10-CM | POA: Diagnosis not present

## 2021-06-16 DIAGNOSIS — Z124 Encounter for screening for malignant neoplasm of cervix: Secondary | ICD-10-CM | POA: Diagnosis not present

## 2021-06-16 DIAGNOSIS — Z3480 Encounter for supervision of other normal pregnancy, unspecified trimester: Secondary | ICD-10-CM | POA: Diagnosis not present

## 2021-06-16 DIAGNOSIS — Z01419 Encounter for gynecological examination (general) (routine) without abnormal findings: Secondary | ICD-10-CM | POA: Diagnosis not present

## 2021-06-16 LAB — OB RESULTS CONSOLE RPR: RPR: NONREACTIVE

## 2021-06-16 LAB — OB RESULTS CONSOLE ABO/RH: RH Type: POSITIVE

## 2021-06-16 LAB — OB RESULTS CONSOLE ANTIBODY SCREEN: Antibody Screen: NEGATIVE

## 2021-06-16 LAB — HEPATITIS C ANTIBODY: HCV Ab: NEGATIVE

## 2021-06-16 LAB — OB RESULTS CONSOLE GC/CHLAMYDIA
Chlamydia: NEGATIVE
Neisseria Gonorrhea: NEGATIVE

## 2021-06-16 LAB — OB RESULTS CONSOLE HEPATITIS B SURFACE ANTIGEN: Hepatitis B Surface Ag: NEGATIVE

## 2021-06-16 LAB — OB RESULTS CONSOLE HIV ANTIBODY (ROUTINE TESTING): HIV: NONREACTIVE

## 2021-06-16 LAB — OB RESULTS CONSOLE RUBELLA ANTIBODY, IGM: Rubella: IMMUNE

## 2021-06-29 DIAGNOSIS — Z419 Encounter for procedure for purposes other than remedying health state, unspecified: Secondary | ICD-10-CM | POA: Diagnosis not present

## 2021-07-30 DIAGNOSIS — Z419 Encounter for procedure for purposes other than remedying health state, unspecified: Secondary | ICD-10-CM | POA: Diagnosis not present

## 2021-08-29 DIAGNOSIS — Z419 Encounter for procedure for purposes other than remedying health state, unspecified: Secondary | ICD-10-CM | POA: Diagnosis not present

## 2021-09-10 DIAGNOSIS — O99019 Anemia complicating pregnancy, unspecified trimester: Secondary | ICD-10-CM | POA: Diagnosis not present

## 2021-09-10 DIAGNOSIS — Z3480 Encounter for supervision of other normal pregnancy, unspecified trimester: Secondary | ICD-10-CM | POA: Diagnosis not present

## 2021-09-29 DIAGNOSIS — Z419 Encounter for procedure for purposes other than remedying health state, unspecified: Secondary | ICD-10-CM | POA: Diagnosis not present

## 2021-09-30 ENCOUNTER — Encounter (HOSPITAL_COMMUNITY): Payer: Self-pay | Admitting: Obstetrics and Gynecology

## 2021-09-30 ENCOUNTER — Inpatient Hospital Stay (HOSPITAL_COMMUNITY)
Admission: AD | Admit: 2021-09-30 | Discharge: 2021-09-30 | Disposition: A | Discharge status: Home / Self Care | Payer: Medicaid Other | Attending: Obstetrics and Gynecology | Admitting: Obstetrics and Gynecology

## 2021-09-30 DIAGNOSIS — O212 Late vomiting of pregnancy: Secondary | ICD-10-CM | POA: Insufficient documentation

## 2021-09-30 DIAGNOSIS — O99891 Other specified diseases and conditions complicating pregnancy: Secondary | ICD-10-CM | POA: Diagnosis not present

## 2021-09-30 DIAGNOSIS — Z3A31 31 weeks gestation of pregnancy: Secondary | ICD-10-CM | POA: Insufficient documentation

## 2021-09-30 DIAGNOSIS — O26893 Other specified pregnancy related conditions, third trimester: Secondary | ICD-10-CM | POA: Insufficient documentation

## 2021-09-30 DIAGNOSIS — N949 Unspecified condition associated with female genital organs and menstrual cycle: Secondary | ICD-10-CM | POA: Diagnosis not present

## 2021-09-30 DIAGNOSIS — O219 Vomiting of pregnancy, unspecified: Secondary | ICD-10-CM | POA: Diagnosis not present

## 2021-09-30 DIAGNOSIS — R102 Pelvic and perineal pain: Secondary | ICD-10-CM | POA: Insufficient documentation

## 2021-09-30 LAB — URINALYSIS, ROUTINE W REFLEX MICROSCOPIC
Bilirubin Urine: NEGATIVE
Glucose, UA: NEGATIVE mg/dL
Hgb urine dipstick: NEGATIVE
Ketones, ur: 80 mg/dL — AB
Nitrite: NEGATIVE
Protein, ur: 30 mg/dL — AB
Specific Gravity, Urine: 1.026 (ref 1.005–1.030)
pH: 5 (ref 5.0–8.0)

## 2021-09-30 MED ORDER — LACTATED RINGERS IV BOLUS
1000.0000 mL | Freq: Once | INTRAVENOUS | Status: AC
  Administered 2021-09-30: 1000 mL via INTRAVENOUS

## 2021-09-30 MED ORDER — CYCLOBENZAPRINE HCL 10 MG PO TABS: 10.0000 mg | ORAL_TABLET | Freq: Two times a day (BID) | ORAL | 0 refills | Status: DC | PRN

## 2021-09-30 MED ORDER — ONDANSETRON 4 MG PO TBDP
4.0000 mg | ORAL_TABLET | Freq: Once | ORAL | Status: AC
  Administered 2021-09-30: 4 mg via ORAL
  Filled 2021-09-30: qty 1

## 2021-09-30 NOTE — MAU Note (Signed)
.  Gail Sims is a 36 y.o. at [redacted]w[redacted]d here in MAU reporting: yesterday she reached behind herself  to grab something and she felt a pain in her stomach"felt like something separated inside her". Pain has continued. Since then stated it comes and goes and feels cramping as well. Reports good fetal movement . Denies any vag bleeding or leaking a this time. Also c/o n/v has not been able to each since yesterday.   Onset of complaint: yesterday Pain score: 6 Vitals:   09/30/21 1242  BP: 116/64  Pulse: 73  Resp: 18  Temp: 98.2 F (36.8 C)     FHT:147 Lab orders placed from triage:  u/a

## 2021-09-30 NOTE — MAU Provider Note (Signed)
History     CSN: [id number]  Arrival date and time: 09/30/21 1206  None     Chief Complaint  Patient presents with   Abdominal Pain   HPI Gail Sims is a 36 y.o. [id number] at [redacted]w[redacted]d who presents to MAU for abdominal pain. She reports that last night she was reaching for something and turned to her right side and felt like "something separated from her uterus on the left side". She does not have pain on the left side, but on her right side. She has concerns for a possible abruption as this pain has occurred in the past and she when she researched her symptoms on Google this something that came up. She reports that pain is intermittent, occurs mostly with movement, such as walking and turning over in bed. Lying down and resting helps improve pain most of the time.   She also reports that she feels very depleted and dehydrated. She reports 2 episodes of vomiting last night. She has not had any today, but feels very nauseous and has a low appetite. She reports she has only had a piece of bread to eat since yesterday because the nausea is so bad. No fever, sick contacts, or changes in diet. She reports she spits a lot, but this has been ongoing. Was previously taking Robinul, however stopped because it made her mouth very dry.   She denies contractions, vaginal bleeding, discharge, leaking fluid. No urinary s/s. Endorses active fetal movement.   Patient receives prenatal care at [facility]. Her next appointment is scheduled on 8/9.  OB History     Gravida  4   Para  2   Term      Preterm  2   AB  1   Living  2      SAB  1   IAB      Ectopic      Multiple      Live Births              Past Medical History:  Diagnosis Date   Bronchitis    Seasonal allergies     Past Surgical History:  Procedure Laterality Date   CESAREAN SECTION      Family History  Problem Relation Age of Onset   Heart disease Mother    Cancer Mother    Cancer Maternal  Grandfather     Social History   Tobacco Use   Smoking status: Never   Smokeless tobacco: Never  Substance Use Topics   Alcohol use: Not Currently   Drug use: Not Currently    Types: Marijuana    Allergies:  Allergies  Allergen Reactions   Pollen Extract Hives, Shortness Of Breath and Itching   Other Hives and Swelling    Pet Dander    No medications prior to admission.   Review of Systems  Constitutional: Negative.   Respiratory: Negative.    Cardiovascular: Negative.   Gastrointestinal:  Positive for abdominal pain and nausea. Negative for constipation and vomiting.  Genitourinary: Negative.   Musculoskeletal: Negative.   Neurological: Negative.    Physical Exam  Patient Vitals for the past 24 hrs:  BP Temp Pulse Resp Height Weight  09/30/21 1536 105/62 -- 70 -- -- --  09/30/21 1300 (!) 111/56 -- 71 -- -- --  09/30/21 1242 116/64 98.2 F (36.8 C) 73 18 5\' 5"  (1.651 m) 70.3 kg   Physical Exam Vitals and nursing note reviewed. Exam conducted with a chaperone present.  Constitutional:      General: She is not in acute distress. Eyes:     Extraocular Movements: Extraocular movements intact.     Pupils: Pupils are equal, round, and reactive to light.  Cardiovascular:     Rate and Rhythm: Normal rate.  Pulmonary:     Effort: Pulmonary effort is normal.  Abdominal:     Palpations: Abdomen is soft.     Tenderness: There is no abdominal tenderness.  Musculoskeletal:        General: Normal range of motion.     Cervical back: Normal range of motion.  Skin:    General: Skin is warm and dry.  Neurological:     General: No focal deficit present.     Mental Status: She is alert and oriented to person, place, and time.  Psychiatric:        Mood and Affect: Mood normal.        Behavior: Behavior normal.        Judgment: Judgment normal.   Dilation: Closed Effacement (%): Thick Exam by:: Camelia Eng, CNM  NST FHR: 145 bpm, moderate variability, +15x15  accels, no decels Toco: occasional ui  MAU Course  Procedures NST  MDM UA with >80 ketones, moderate leukocytes and large amount of bacteria. Urine culture added. Given patient's symptoms LR bolus given. Zofran ODT given prior to urine resulted.  Patient recently had normal vaginal swabs in office so declines today I offered PO Tylenol and Flexeril. Patient declines as she drove self, but will send Flexeril rx to pharmacy. Heating pad was given to patient who reports it is helping improve pain.  NST reassuring for gestational age. Toco with intermittent ui, patient unaware. Cervix closed/thick.  After LR bolus, patient reports feeling much better. Instructed patient to keep OB appointment as scheduled. Patient has Zofran at home and is to continue taking as needed. Will send Flexeril to pharmacy. Recommend Tylenol prn, heat, PO hydration as well as maternity support belt.    Assessment and Plan  [redacted] weeks gestation of pregnancy Round ligament pain Nausea and vomiting in pregnancy  - Discharge home in stable condition - Rx for Flexeril sent to pharmacy - Keep OB appointment as scheduled - Strict return precautions reviewed at length - Return to MAU as needed or new/worsening symptoms   Brand Males, CNM 09/30/2021, 3:43 PM

## 2021-10-01 LAB — CULTURE, OB URINE: Culture: <10000 — AB

## 2021-10-07 DIAGNOSIS — O99019 Anemia complicating pregnancy, unspecified trimester: Secondary | ICD-10-CM | POA: Diagnosis not present

## 2021-10-19 ENCOUNTER — Encounter (HOSPITAL_COMMUNITY): Payer: Self-pay | Admitting: Obstetrics and Gynecology

## 2021-10-19 ENCOUNTER — Inpatient Hospital Stay (HOSPITAL_COMMUNITY)
Admission: AD | Admit: 2021-10-19 | Discharge: 2021-10-19 | Disposition: A | Discharge status: Home / Self Care | Payer: Medicaid Other | Attending: Obstetrics and Gynecology | Admitting: Obstetrics and Gynecology

## 2021-10-19 DIAGNOSIS — O26893 Other specified pregnancy related conditions, third trimester: Secondary | ICD-10-CM | POA: Insufficient documentation

## 2021-10-19 DIAGNOSIS — L509 Urticaria, unspecified: Secondary | ICD-10-CM

## 2021-10-19 DIAGNOSIS — Z3A34 34 weeks gestation of pregnancy: Secondary | ICD-10-CM

## 2021-10-19 DIAGNOSIS — Z3689 Encounter for other specified antenatal screening: Secondary | ICD-10-CM

## 2021-10-19 MED ORDER — PREDNISONE 5 MG PO TABS: 5.0000 mg | ORAL_TABLET | Freq: Every day | ORAL | 0 refills | Status: AC

## 2021-10-19 MED ORDER — DEXAMETHASONE 6 MG PO TABS
12.0000 mg | ORAL_TABLET | Freq: Once | ORAL | Status: AC
  Administered 2021-10-19: 12 mg via ORAL
  Filled 2021-10-19: qty 2

## 2021-10-19 NOTE — MAU Note (Signed)
Pt says she itches all over -with rash- has applied calamine lotion, oatmeal bathes, excema meds , and took benadryl - relieves itches temp- but rash never leaves . Started 1 week ago in buttocks area.. Has an appointment Thursday  Denies UC

## 2021-10-19 NOTE — MAU Provider Note (Signed)
S [name] is a 36 y.o. [phone] pregnant at [redacted]w[redacted]d who presents to MAU today with complaint of full body hives that began about a week ago. Pt notes that they started in the crease between her buttocks and legs, then spread upward to her back and downward to her legs. Are now covering her face, arms, legs, back, and chest - very itchy everywhere. She has tried Benadryl but it does not work well, Calamine lotion relieves the itching for about . Only new thing she has introduced is her iron supplement that began about 3wks ago. No other new lotion/detergent/clothing, etc. Denies vaginal discharge/bleeding or abdominal pain. No other physical complaints, nor difficulty breathing.   Receives care at [facility]. Prenatal records reviewed.  Pertinent items noted in HPI and remainder of comprehensive ROS otherwise negative.   O BP 114/68 (BP Location: Left Arm)   Pulse 88   Temp 98.8 F (37.1 C) (Oral)   Resp 18   Ht 5\' 5"  (1.651 m)   Wt 161 lb (73 kg)   LMP 02/22/2021 (Exact Date)   BMI 26.79 kg/m  Physical Exam Vitals and nursing note reviewed.  Constitutional:      General: She is not in acute distress.    Appearance: She is not ill-appearing.  Eyes:     Pupils: Pupils are equal, round, and reactive to light.  Cardiovascular:     Rate and Rhythm: Normal rate and regular rhythm.  Pulmonary:     Effort: Pulmonary effort is normal.  Musculoskeletal:        General: Normal range of motion.  Skin:    General: Skin is warm and dry.     Capillary Refill: Capillary refill takes less than 2 seconds.     Findings: Rash (Hives noted on face, arms, legs, back and belly) present.  Neurological:     Mental Status: She is alert and oriented to person, place, and time.  Psychiatric:        Mood and Affect: Mood normal.        Behavior: Behavior normal.        Thought Content: Thought content normal.        Judgment: Judgment normal.    Fetal Tracing: reactive   Baseline: 130 Variability: moderate Accelerations: 15x15 Decelerations: none Toco: relaxed  MDM/MAU Course: NST reactive, pt stable but itchy and covered in hives. Possibly due to new OTC iron. Instructed her to stop taking it and see if hives clear. If so, she can try to restart with a food based iron supplement like Blood Builder. Oral decadron given, will send home on short burst of steroids to help calm reaction. Allergist referral placed in case reaction does not stop with cessation of iron or returns after steroid burst.  A Full body hives NST reactive [redacted] weeks gestation of pregnancy Medical screening exam complete  P Discharge from MAU in stable condition with return precautions Follow up at [facility] as scheduled for ongoing prenatal care  Allergies as of 10/19/2021       Reactions   Pollen Extract Hives, Shortness Of Breath, Itching   Other Hives, Swelling   Pet Dander        Medication List     TAKE these medications    albuterol 108 (90 Base) MCG/ACT inhaler Commonly known as: VENTOLIN HFA USE 2 PUFFS PO Q 4 TO 6 H PRN SOB AND WHEEZING   cyclobenzaprine 10 MG tablet Commonly known as: FLEXERIL Take 1 tablet (  10 mg total) by mouth 2 (two) times daily as needed for muscle spasms.   glycopyrrolate 2 MG tablet Commonly known as: ROBINUL Take 1 tablet (2 mg total) by mouth 3 (three) times daily as needed.   metoCLOPramide 10 MG tablet Commonly known as: Reglan Take 1 tablet (10 mg total) by mouth 3 (three) times daily with meals as needed for nausea.   ondansetron 4 MG disintegrating tablet Commonly known as: ZOFRAN-ODT Take 1 tablet (4 mg total) by mouth every 8 (eight) hours as needed for nausea or vomiting.   polyethylene glycol 17 g packet Commonly known as: MiraLax Take 17 g by mouth daily.   predniSONE 5 MG tablet Commonly known as: DELTASONE Take 1 tablet (5 mg total) by mouth daily with breakfast for 5 days.   promethazine 12.5 MG  tablet Commonly known as: PHENERGAN Take 1-2 tablets (12.5-25 mg total) by mouth at bedtime as needed for nausea or vomiting.   scopolamine 1 MG/3DAYS Commonly known as: TRANSDERM-SCOP Place 1 patch (1.5 mg total) onto the skin every 3 (three) days.        Bernerd Limbo, [place] 10/19/2021 11:13 PM

## 2021-10-22 DIAGNOSIS — Z369 Encounter for antenatal screening, unspecified: Secondary | ICD-10-CM | POA: Diagnosis not present

## 2021-10-22 DIAGNOSIS — Z3A34 34 weeks gestation of pregnancy: Secondary | ICD-10-CM | POA: Diagnosis not present

## 2021-10-30 DIAGNOSIS — Z419 Encounter for procedure for purposes other than remedying health state, unspecified: Secondary | ICD-10-CM | POA: Diagnosis not present

## 2021-11-05 LAB — OB RESULTS CONSOLE GBS: GBS: POSITIVE

## 2021-11-26 ENCOUNTER — Telehealth (HOSPITAL_COMMUNITY): Payer: Self-pay | Admitting: *Deleted

## 2021-11-26 NOTE — Telephone Encounter (Signed)
Preadmission screen  

## 2021-11-27 ENCOUNTER — Encounter (HOSPITAL_COMMUNITY): Payer: Self-pay | Admitting: Obstetrics and Gynecology

## 2021-11-27 ENCOUNTER — Encounter (HOSPITAL_COMMUNITY): Payer: Self-pay | Admitting: *Deleted

## 2021-11-27 ENCOUNTER — Inpatient Hospital Stay (HOSPITAL_COMMUNITY)
Admission: AD | Admit: 2021-11-27 | Discharge: 2021-11-29 | DRG: 807 | Disposition: A | Discharge status: Home / Self Care | Payer: Medicaid Other | Attending: Obstetrics and Gynecology | Admitting: Obstetrics and Gynecology

## 2021-11-27 ENCOUNTER — Other Ambulatory Visit: Payer: Self-pay

## 2021-11-27 DIAGNOSIS — Z419 Encounter for procedure for purposes other than remedying health state, unspecified: Secondary | ICD-10-CM | POA: Diagnosis not present

## 2021-11-27 DIAGNOSIS — O34219 Maternal care for unspecified type scar from previous cesarean delivery: Secondary | ICD-10-CM | POA: Diagnosis not present

## 2021-11-27 DIAGNOSIS — O9902 Anemia complicating childbirth: Secondary | ICD-10-CM | POA: Diagnosis present

## 2021-11-27 DIAGNOSIS — O4292 Full-term premature rupture of membranes, unspecified as to length of time between rupture and onset of labor: Principal | ICD-10-CM | POA: Diagnosis present

## 2021-11-27 DIAGNOSIS — O4290 Premature rupture of membranes, unspecified as to length of time between rupture and onset of labor, unspecified weeks of gestation: Principal | ICD-10-CM | POA: Diagnosis present

## 2021-11-27 DIAGNOSIS — O99824 Streptococcus B carrier state complicating childbirth: Secondary | ICD-10-CM | POA: Diagnosis not present

## 2021-11-27 DIAGNOSIS — Z3A39 39 weeks gestation of pregnancy: Secondary | ICD-10-CM

## 2021-11-27 DIAGNOSIS — O26893 Other specified pregnancy related conditions, third trimester: Secondary | ICD-10-CM | POA: Diagnosis not present

## 2021-11-27 LAB — POCT FERN TEST: POCT Fern Test: POSITIVE

## 2021-11-27 LAB — CBC
HCT: 31.1 % — ABNORMAL LOW (ref 36.0–46.0)
Hemoglobin: 9.9 g/dL — ABNORMAL LOW (ref 12.0–15.0)
MCH: 28.2 pg (ref 26.0–34.0)
MCHC: 31.8 g/dL (ref 30.0–36.0)
MCV: 88.6 fL (ref 80.0–100.0)
Platelets: 184 10*3/uL (ref 150–400)
RBC: 3.51 MIL/uL — ABNORMAL LOW (ref 3.87–5.11)
RDW: 13.5 % (ref 11.5–15.5)
WBC: 8.4 10*3/uL (ref 4.0–10.5)
nRBC: 0 % (ref 0.0–0.2)

## 2021-11-27 MED ORDER — ONDANSETRON HCL 4 MG/2ML IJ SOLN: 4.0000 mg | Freq: Four times a day (QID) | INTRAMUSCULAR | Status: DC | PRN

## 2021-11-27 MED ORDER — OXYTOCIN-SODIUM CHLORIDE 30-0.9 UT/500ML-% IV SOLN
2.5000 [IU]/h | INTRAVENOUS | Status: DC
  Administered 2021-11-28: 2.5 [IU]/h via INTRAVENOUS
  Filled 2021-11-27: qty 500

## 2021-11-27 MED ORDER — ACETAMINOPHEN 325 MG PO TABS: 650.0000 mg | ORAL_TABLET | ORAL | Status: DC | PRN

## 2021-11-27 MED ORDER — OXYCODONE-ACETAMINOPHEN 5-325 MG PO TABS: 1.0000 | ORAL_TABLET | ORAL | Status: DC | PRN

## 2021-11-27 MED ORDER — SODIUM CHLORIDE 0.9 % IV SOLN
5.0000 10*6.[IU] | Freq: Once | INTRAVENOUS | Status: AC
  Administered 2021-11-27: 5 10*6.[IU] via INTRAVENOUS
  Filled 2021-11-27: qty 5

## 2021-11-27 MED ORDER — FENTANYL CITRATE (PF) 100 MCG/2ML IJ SOLN
50.0000 ug | INTRAMUSCULAR | Status: DC | PRN
  Administered 2021-11-28 (×2): 100 ug via INTRAVENOUS
  Administered 2021-11-28: 50 ug via INTRAVENOUS
  Filled 2021-11-27 (×3): qty 2

## 2021-11-27 MED ORDER — OXYTOCIN BOLUS FROM INFUSION
333.0000 mL | Freq: Once | INTRAVENOUS | Status: AC
  Administered 2021-11-28: 333 mL via INTRAVENOUS

## 2021-11-27 MED ORDER — LACTATED RINGERS IV SOLN: 500.0000 mL | INTRAVENOUS | Status: DC | PRN

## 2021-11-27 MED ORDER — LACTATED RINGERS IV SOLN: INTRAVENOUS | Status: DC

## 2021-11-27 MED ORDER — LIDOCAINE HCL (PF) 1 % IJ SOLN: 30.0000 mL | INTRAMUSCULAR | Status: DC | PRN

## 2021-11-27 MED ORDER — SOD CITRATE-CITRIC ACID 500-334 MG/5ML PO SOLN: 30.0000 mL | ORAL | Status: DC | PRN

## 2021-11-27 MED ORDER — PENICILLIN G POT IN DEXTROSE 60000 UNIT/ML IV SOLN
3.0000 10*6.[IU] | INTRAVENOUS | Status: DC
  Administered 2021-11-28 (×3): 3 10*6.[IU] via INTRAVENOUS
  Filled 2021-11-27 (×3): qty 50

## 2021-11-27 MED ORDER — OXYCODONE-ACETAMINOPHEN 5-325 MG PO TABS: 2.0000 | ORAL_TABLET | ORAL | Status: DC | PRN

## 2021-11-27 NOTE — MAU Note (Signed)
Pt says she thinks SROM -  At 4 pm- while going to restroom- felt leaking- voided- then started leaking again - stills feels now [place]-  1 cm - Sch for an induction on Monday Denies HSV GBS- positive Plans for Vag del

## 2021-11-27 NOTE — H&P (Incomplete)
Gail Sims is a 36 y.o. female [phone] [redacted]w[redacted]d presenting for LOF. She reports leaking clear fluid starting at 1600 today. Wasn't sure if she had ruptured membranes so waited to see if contractions would start before coming into MAU. She is now having "6/10" contractions every 5-20 mins. No VB. Normal FM.  In MAU, ROM was confirmed. On spec exam, cervix appeared to be 1cm dilated, which is similar to her last exam in the office (1/70/-2 on 11/25/21). Vertex was confirmed on [place]  Pregnancy c/b: History of cesarean: has SVD with first pregnancy in 2007 (PPROM at 36 weeks), followed by a primary cesarean in 2016 (placenta previa at 36 weeks). She has been consented for a TOLAC.  Refusal of blood products for religious reasons (see below)  OB History     Gravida  4   Para  2   Term      Preterm  2   AB  1   Living  2      SAB  1   IAB      Ectopic      Multiple      Live Births  2          Past Medical History:  Diagnosis Date  . Bronchitis   . Seasonal allergies    Past Surgical History:  Procedure Laterality Date  . CESAREAN SECTION     Family History: family history includes Cancer in her maternal grandfather and mother; Heart disease in her mother. Social History:  reports that she has never smoked. She has never used smokeless tobacco. She reports that she does not currently use alcohol. She reports that she does not currently use drugs after having used the following drugs: Marijuana.     Maternal Diabetes: No Genetic Screening: Normal Maternal Ultrasounds/Referrals: Normal Fetal Ultrasounds or other Referrals:  Other:  EFW 46.6% (5lb8oz) on 8/24 ([redacted]w[redacted]d) Maternal Substance Abuse:  none Significant Maternal Medications:  None Significant Maternal Lab Results:  {Significant Maternal Lab Results:20235} Other Comments:  {Other Comments:20251}  Review of Systems Per HPI Exam Physical Exam    Blood pressure 121/66, pulse 79, temperature 97.9 F (36.6  C), temperature source Oral, resp. rate 16, height 5\' 5"  (1.651 m), weight 73.9 kg, last menstrual period 02/22/2021, unknown if currently breastfeeding. ***NAD, resting comfortably, ***painful with contractions ***Gravid abdomen Fetal testing: *** Prenatal labs: ABO, Rh:  A/Positive/-- (04/18 0000) Antibody: Negative (04/18 0000) Rubella: Immune (04/18 0000) RPR: Nonreactive (04/18 0000)  HBsAg: Negative (04/18 0000)  HIV: Non-reactive (04/18 0000)  GBS: Positive/-- (09/07 0000)   Assessment/Plan: Gail Sims 11/27/2021, 11:55 PM

## 2021-11-27 NOTE — MAU Provider Note (Signed)
None      S: Ms. Azelie Noguera is a 36 y.o. [phone] at [redacted]w[redacted]d  who presents to MAU today complaining of leaking of fluid since 3 pm. She denies vaginal bleeding. She endorses contractions. She reports normal fetal movement.    O: BP 121/66   Pulse 79   Temp 97.9 F (36.6 C) (Oral)   Resp 16   Ht 5\' 5"  (1.651 m)   Wt 73.9 kg   LMP 02/22/2021 (Exact Date)   BMI 27.12 kg/m  GENERAL: Well-developed, well-nourished female in no acute distress.  HEAD: Normocephalic, atraumatic.  CHEST: Normal effort of breathing, regular heart rate ABDOMEN: Soft, nontender, gravid PELVIC: Normal external female genitalia. Vagina is pink and rugated. Cervix with normal contour, no lesions. Normal discharge.  equivocal pooling of milky fluid with Valsalva.   Cervical exam:    Visually 1 cm/50% effaced   Fetal Monitoring: Baseline: 135 Variability: moderate Accelerations: present Decelerations: absent Contractions: irregular, mild to palpation  Results for orders placed or performed during the hospital encounter of 11/27/21 (from the past 24 hour(s))  CBC     Status: Abnormal   Collection Time: 11/27/21 10:24 PM  Result Value Ref Range   WBC 8.4 4.0 - 10.5 K/uL   RBC 3.51 (L) 3.87 - 5.11 MIL/uL   Hemoglobin 9.9 (L) 12.0 - 15.0 g/dL   HCT 31.1 (L) 36.0 - 46.0 %   MCV 88.6 80.0 - 100.0 fL   MCH 28.2 26.0 - 34.0 pg   MCHC 31.8 30.0 - 36.0 g/dL   RDW 13.5 11.5 - 15.5 %   Platelets 184 150 - 400 K/uL   nRBC 0.0 0.0 - 0.2 %  Fern Test     Status: None   Collection Time: 11/27/21 11:19 PM  Result Value Ref Range   POCT Fern Test Positive = ruptured amniotic membanes      A: SIUP at [redacted]w[redacted]d  SROM  P: RN to call pt attending for admission to L&D  Elvera Maria, CNM 11/27/2021 11:49 PM

## 2021-11-27 NOTE — H&P (Signed)
Gail Sims is a 36 y.o. female [phone] [redacted]w[redacted]d presenting for LOF. She reports leaking clear fluid starting at 1600 today. Wasn't sure if she had ruptured membranes so waited to see if contractions would start before coming into MAU. She is now having "6/10" contractions every 5-20 mins. No VB. Normal FM.  In MAU, ROM was confirmed. On spec exam, cervix appeared to be 1cm dilated, which is similar to her last exam in the office (1/70/-2 on 11/25/21). Vertex was confirmed on [place]  Pregnancy c/b: History of cesarean: has SVD with first pregnancy in 2007 (PPROM at 36 weeks), followed by a primary cesarean in 2016 (placenta previa at 36 weeks). She has been consented for a TOLAC.  Refusal of blood products for religious reasons (see below)  OB History     Gravida  4   Para  2   Term      Preterm  2   AB  1   Living  2      SAB  1   IAB      Ectopic      Multiple      Live Births  2          Past Medical History:  Diagnosis Date   Bronchitis    Seasonal allergies    Past Surgical History:  Procedure Laterality Date   CESAREAN SECTION     Family History: family history includes Cancer in her maternal grandfather and mother; Heart disease in her mother. Social History:  reports that she has never smoked. She has never used smokeless tobacco. She reports that she does not currently use alcohol. She reports that she does not currently use drugs after having used the following drugs: Marijuana.     Maternal Diabetes: No Genetic Screening: Normal Maternal Ultrasounds/Referrals: Normal Fetal Ultrasounds or other Referrals:  Other:  EFW 46.6% (5lb8oz) on 8/24 ([redacted]w[redacted]d) Maternal Substance Abuse:  none Significant Maternal Medications:  None Significant Maternal Lab Results:  Group B Strep positive Other Comments:  None  Review of Systems Per HPI Exam Physical Exam    Blood pressure 121/66, pulse 79, temperature 97.9 F (36.6 C), temperature source Oral, resp.  rate 16, height 5\' 5"  (1.651 m), weight 73.9 kg, last menstrual period 02/22/2021, unknown if currently breastfeeding. Gen: NAD, resting comfortably CVS: normal pulses Lungs: nonlabored respirations Abd: Gravid abdomen Leopolds 6.5#  Fetal testing: 130bpm, mod variability, + accels, no decels Toco: irregular  Spec exam per MAU CNM: +pooling/nitrazine/fern, visually 1cm dilated Prenatal labs: ABO, Rh:  A/Positive/-- (04/18 0000) Antibody: Negative (04/18 0000) Rubella: Immune (04/18 0000) RPR: Nonreactive (04/18 0000)  HBsAg: Negative (04/18 0000)  HIV: Non-reactive (04/18 0000)  GBS: Positive/-- (09/07 0000)   Assessment/Plan: 36Y 11-09-1975 @ [redacted]w[redacted]d, PROM Fetal wellbeing: category I tracing PROM: discussed with patient ROM 8 hours ago, with only mild and irregular contractions and no apparent cervical change. Would recommend augmentation of labor with pitocin due to infection risk. She declines pitocin at this time, but is amenable to starting at 2am if no change at that time. She feels like contractions are becoming more intense and hopes she can avoid intervention if possible. Will plan to assess contractions and exam at 2am. TOLAC: reviewed consent. Reviewed risk of failed TOLAC, need for emergent cesarean, uterine rupture, all of which increase morbidity for both her and her baby. Overall VBAC is about 60-80% successful, and her specific history makes her a reasonable candidate. She states understanding and wishes to proceed. Refusal  of blood products: patient expresses religious reasons for refusal of blood products. She refuses all blood products including albumin, but DOES consent to cell saver. She states understanding that this decision could result in serious morbidity or even death. Hospital form signed. Myriam Jacobson RN present for conversation.  GBS pos: penicillin Pain control: at this time, not planning for epidural, but okay with IV pain meds.   Luther Redo, MD 11/28/21 12:12 AM    Rowland Lathe 11/27/2021, 11:55 PM

## 2021-11-28 ENCOUNTER — Encounter (HOSPITAL_COMMUNITY): Payer: Self-pay | Admitting: Obstetrics and Gynecology

## 2021-11-28 DIAGNOSIS — Z3A39 39 weeks gestation of pregnancy: Secondary | ICD-10-CM | POA: Diagnosis not present

## 2021-11-28 DIAGNOSIS — O34219 Maternal care for unspecified type scar from previous cesarean delivery: Secondary | ICD-10-CM | POA: Diagnosis present

## 2021-11-28 LAB — RPR: RPR Ser Ql: NONREACTIVE

## 2021-11-28 MED ORDER — DIPHENHYDRAMINE HCL 50 MG/ML IJ SOLN: 12.5000 mg | INTRAMUSCULAR | Status: DC | PRN

## 2021-11-28 MED ORDER — COCONUT OIL OIL: 1.0000 | TOPICAL_OIL | Status: DC | PRN

## 2021-11-28 MED ORDER — FENTANYL-BUPIVACAINE-NACL 0.5-0.125-0.9 MG/250ML-% EP SOLN: 12.0000 mL/h | EPIDURAL | Status: DC | PRN

## 2021-11-28 MED ORDER — BENZOCAINE-MENTHOL 20-0.5 % EX AERO
1.0000 | INHALATION_SPRAY | CUTANEOUS | Status: DC | PRN
  Administered 2021-11-28: 1 via TOPICAL
  Filled 2021-11-28: qty 56

## 2021-11-28 MED ORDER — TRANEXAMIC ACID-NACL 1000-0.7 MG/100ML-% IV SOLN: 1000.0000 mg | Freq: Once | INTRAVENOUS | Status: DC

## 2021-11-28 MED ORDER — PHENYLEPHRINE 80 MCG/ML (10ML) SYRINGE FOR IV PUSH (FOR BLOOD PRESSURE SUPPORT): 80.0000 ug | PREFILLED_SYRINGE | INTRAVENOUS | Status: DC | PRN

## 2021-11-28 MED ORDER — PRENATAL MULTIVITAMIN CH
1.0000 | ORAL_TABLET | Freq: Every day | ORAL | Status: DC
  Administered 2021-11-29: 1 via ORAL
  Filled 2021-11-28: qty 1

## 2021-11-28 MED ORDER — IBUPROFEN 600 MG PO TABS
600.0000 mg | ORAL_TABLET | Freq: Four times a day (QID) | ORAL | Status: DC
  Administered 2021-11-28 – 2021-11-29 (×5): 600 mg via ORAL
  Filled 2021-11-28 (×5): qty 1

## 2021-11-28 MED ORDER — METHYLERGONOVINE MALEATE 0.2 MG/ML IJ SOLN
INTRAMUSCULAR | Status: AC
  Administered 2021-11-28: 0.2 mg
  Filled 2021-11-28: qty 1

## 2021-11-28 MED ORDER — DIPHENHYDRAMINE HCL 25 MG PO CAPS: 25.0000 mg | ORAL_CAPSULE | Freq: Four times a day (QID) | ORAL | Status: DC | PRN

## 2021-11-28 MED ORDER — LACTATED RINGERS IV SOLN: 500.0000 mL | Freq: Once | INTRAVENOUS | Status: DC

## 2021-11-28 MED ORDER — ACETAMINOPHEN 325 MG PO TABS: 650.0000 mg | ORAL_TABLET | ORAL | Status: DC | PRN

## 2021-11-28 MED ORDER — EPHEDRINE 5 MG/ML INJ: 10.0000 mg | INTRAVENOUS | Status: DC | PRN

## 2021-11-28 MED ORDER — SIMETHICONE 80 MG PO CHEW: 80.0000 mg | CHEWABLE_TABLET | ORAL | Status: DC | PRN

## 2021-11-28 MED ORDER — OXYTOCIN-SODIUM CHLORIDE 30-0.9 UT/500ML-% IV SOLN
1.0000 m[IU]/min | INTRAVENOUS | Status: DC
  Administered 2021-11-28: 1 m[IU]/min via INTRAVENOUS

## 2021-11-28 MED ORDER — DIBUCAINE (PERIANAL) 1 % EX OINT: 1.0000 | TOPICAL_OINTMENT | CUTANEOUS | Status: DC | PRN

## 2021-11-28 MED ORDER — ONDANSETRON HCL 4 MG PO TABS: 4.0000 mg | ORAL_TABLET | ORAL | Status: DC | PRN

## 2021-11-28 MED ORDER — ZOLPIDEM TARTRATE 5 MG PO TABS: 5.0000 mg | ORAL_TABLET | Freq: Every evening | ORAL | Status: DC | PRN

## 2021-11-28 MED ORDER — TETANUS-DIPHTH-ACELL PERTUSSIS 5-2.5-18.5 LF-MCG/0.5 IM SUSY: 0.5000 mL | PREFILLED_SYRINGE | Freq: Once | INTRAMUSCULAR | Status: DC

## 2021-11-28 MED ORDER — TRANEXAMIC ACID-NACL 1000-0.7 MG/100ML-% IV SOLN
INTRAVENOUS | Status: AC
  Administered 2021-11-28: 1000 mg
  Filled 2021-11-28: qty 100

## 2021-11-28 MED ORDER — SENNOSIDES-DOCUSATE SODIUM 8.6-50 MG PO TABS
2.0000 | ORAL_TABLET | ORAL | Status: DC
  Administered 2021-11-28: 2 via ORAL
  Filled 2021-11-28: qty 2

## 2021-11-28 MED ORDER — METHYLERGONOVINE MALEATE 0.2 MG/ML IJ SOLN: 0.2000 mg | Freq: Once | INTRAMUSCULAR | Status: DC

## 2021-11-28 MED ORDER — ONDANSETRON HCL 4 MG/2ML IJ SOLN: 4.0000 mg | INTRAMUSCULAR | Status: DC | PRN

## 2021-11-28 MED ORDER — TERBUTALINE SULFATE 1 MG/ML IJ SOLN: 0.2500 mg | Freq: Once | INTRAMUSCULAR | Status: DC | PRN

## 2021-11-28 MED ORDER — WITCH HAZEL-GLYCERIN EX PADS: 1.0000 | MEDICATED_PAD | CUTANEOUS | Status: DC | PRN

## 2021-11-28 NOTE — Plan of Care (Signed)
Pt demonstrated understanding 

## 2021-11-28 NOTE — Lactation Note (Signed)
This note was copied from a baby's chart. Lactation Consultation Note  Patient Name: Gail Sims [id number] Date: 11/28/2021 Reason for consult: Initial assessment;Term Age:34 hours  LC in to room for initial consult. Infant is latched upon arrival. LC reviewed alignment, optimal breast C-hold and neck/back support. Lactating parent (LP) verbalizes comfort with positioning and latch. Encouraged expressing breast milk, guide from nose to mouth until big gape. Reviewed using pillows for comfort. Colostrum present with hand expression.  Reviewed normal newborn behavior during first 24h, expected output and feeding frequency. LP is experienced, breastfed 2 older children for >5 months.   Plan: 1-Skin to skin, aim for a deep, comfortable latch and breastfeed on demand or 8-12 times in 24h period. 2-Encouraged maternal rest, hydration and food intake.  3-Contact LC as needed for feeds/support/concerns/questions   All questions answered at this time. Provided Lactation services brochure and other local resources.    Maternal Data Has patient been taught Hand Expression?: Yes Does the patient have breastfeeding experience prior to this delivery?: Yes How long did the patient breastfeed?: >5 months x2  Feeding Mother's Current Feeding Choice: Breast Milk  LATCH Score Latch: Grasps breast easily, tongue down, lips flanged, rhythmical sucking.  Audible Swallowing: Spontaneous and intermittent  Type of Nipple: Everted at rest and after stimulation  Comfort (Breast/Nipple): Soft / non-tender  Hold (Positioning): Assistance needed to correctly position infant at breast and maintain latch.  LATCH Score: 9  Interventions Interventions: Breast feeding basics reviewed;Assisted with latch;Skin to skin;Breast massage;Hand express;Adjust position;Support pillows;Expressed milk;Breast compression;Education;LC Services brochure  Discharge Pump: Personal WIC Program: No  Consult  Status Consult Status: Follow-up Date: 11/29/21 Follow-up type: In-patient    Gail Sims [name] 11/28/2021, 3:36 PM

## 2021-11-28 NOTE — Progress Notes (Signed)
Pt up to bathroom.

## 2021-11-28 NOTE — Progress Notes (Signed)
Patient ID: Gail Sims, female   DOB: 05-07-1985, 36 y.o.   MRN: [id number] Pt progressed to strong contractions and is now progressing rapidly Coping well with IV pain meds  Afeb VSS FHR category 1 with occasional variable decels  Cervix c/8/0  Progressing well.  Expect to be pushing soon FSE placed because FHR not tracing well On PCN for + GBS

## 2021-11-29 DIAGNOSIS — Z419 Encounter for procedure for purposes other than remedying health state, unspecified: Secondary | ICD-10-CM | POA: Diagnosis not present

## 2021-11-29 LAB — CBC
HCT: 27.3 % — ABNORMAL LOW (ref 36.0–46.0)
Hemoglobin: 8.7 g/dL — ABNORMAL LOW (ref 12.0–15.0)
MCH: 28.1 pg (ref 26.0–34.0)
MCHC: 31.9 g/dL (ref 30.0–36.0)
MCV: 88.1 fL (ref 80.0–100.0)
Platelets: 184 10*3/uL (ref 150–400)
RBC: 3.1 MIL/uL — ABNORMAL LOW (ref 3.87–5.11)
RDW: 13.8 % (ref 11.5–15.5)
WBC: 14.8 10*3/uL — ABNORMAL HIGH (ref 4.0–10.5)
nRBC: 0 % (ref 0.0–0.2)

## 2021-11-29 MED ORDER — ACETAMINOPHEN 325 MG PO TABS: 650.0000 mg | ORAL_TABLET | ORAL | 0 refills | Status: AC | PRN

## 2021-11-29 MED ORDER — IBUPROFEN 600 MG PO TABS: 600.0000 mg | ORAL_TABLET | Freq: Four times a day (QID) | ORAL | 0 refills | Status: AC

## 2021-11-29 NOTE — Discharge Summary (Signed)
Postpartum Discharge Summary       Patient Name: Gail Sims DOB: May 14, 1985 MRN: [id number]  Date of admission: 11/27/2021 Delivery date:11/28/2021  Delivering provider: Huel Cote  Date of discharge: 11/29/2021  Admitting diagnosis: PROM (premature rupture of membranes) [O42.90] VBAC, delivered [O34.219] Intrauterine pregnancy: [redacted]w[redacted]d     Secondary diagnosis:  Principal Problem:   PROM (premature rupture of membranes) Active Problems:   VBAC, delivered  Additional problems: none    Discharge diagnosis: Term Pregnancy Delivered                                              Post partum procedures: none Augmentation: Pitocin Complications: None  Hospital course: Onset of Labor With Vaginal Delivery      36 y.o. yo [id number] at [redacted]w[redacted]d was admitted in Latent Labor on 11/27/2021. Patient had an uncomplicated labor course as follows:  Membrane Rupture Time/Date:  ,   Delivery Method:Vaginal, Spontaneous  Episiotomy: None  Lacerations:    Patient had an uncomplicated postpartum course.  She is ambulating, tolerating a regular diet, passing flatus, and urinating well. Patient is discharged home in stable condition on 11/29/21.  Newborn Data: Birth date:11/28/2021  Birth time:10:45 AM  Gender:Female  Living status:Living  Apgars:9 ,9  Weight:3090 g     Physical exam  Vitals:   11/28/21 1538 11/28/21 1940 11/28/21 2350 11/29/21 0500  BP: 108/74 103/68 111/70 99/69  Pulse: 70 80 91 71  Resp: 16 17 18 18   Temp: 98.8 F (37.1 C) 98.7 F (37.1 C) 99 F (37.2 C) 98.2 F (36.8 C)  TempSrc: Oral Oral Oral Axillary  SpO2: 100% 100% 100% 100%  Weight:      Height:       General: alert and cooperative Lochia: appropriate Uterine Fundus: firm  Labs: Lab Results  Component Value Date   WBC 14.8 (H) 11/29/2021   HGB 8.7 (L) 11/29/2021   HCT 27.3 (L) 11/29/2021   MCV 88.1 11/29/2021   PLT 184 11/29/2021      Latest Ref Rng & Units 11/13/2020   10:05 AM   CMP  Glucose 70 - 99 mg/dL 88   BUN 6 - 20 mg/dL 9   Creatinine 11/15/2020 - 6.78 mg/dL 9.38   Sodium 1.01 - 751 mmol/L 133   Potassium 3.5 - 5.1 mmol/L 3.2   Chloride 98 - 111 mmol/L 101   CO2 22 - 32 mmol/L 21   Calcium 8.9 - 10.3 mg/dL 9.3   Total Protein 6.5 - 8.1 g/dL 7.2   Total Bilirubin 0.3 - 1.2 mg/dL 1.0   Alkaline Phos 38 - 126 U/L 44   AST 15 - 41 U/L 20   ALT 0 - 44 U/L 14    Edinburgh Score:     No data to display           After visit meds:  Allergies as of 11/29/2021       Reactions   Pollen Extract Hives, Shortness Of Breath, Itching   Other Hives, Swelling   Pet Dander   Beef-derived Products Other (See Comments)   dietary        Medication List     STOP taking these medications    cyclobenzaprine 10 MG tablet Commonly known as: FLEXERIL   glycopyrrolate 2 MG tablet Commonly known as: ROBINUL   metoCLOPramide 10 MG tablet  Commonly known as: Reglan   ondansetron 4 MG disintegrating tablet Commonly known as: ZOFRAN-ODT   promethazine 12.5 MG tablet Commonly known as: PHENERGAN   scopolamine 1 MG/3DAYS Commonly known as: TRANSDERM-SCOP       TAKE these medications    acetaminophen 325 MG tablet Commonly known as: Tylenol Take 2 tablets (650 mg total) by mouth every 4 (four) hours as needed (for pain scale < 4).   albuterol 108 (90 Base) MCG/ACT inhaler Commonly known as: VENTOLIN HFA USE 2 PUFFS PO Q 4 TO 6 H PRN SOB AND WHEEZING   ibuprofen 600 MG tablet Commonly known as: ADVIL Take 1 tablet (600 mg total) by mouth every 6 (six) hours.   polyethylene glycol 17 g packet Commonly known as: MiraLax Take 17 g by mouth daily.         Discharge home in stable condition Infant Feeding: Breast Infant Disposition:home with mother Discharge instruction: per After Visit Summary and Postpartum booklet. Activity: Advance as tolerated. Pelvic rest for 6 weeks.  Diet: routine diet Future Appointments:No future  appointments. Follow up Visit:  Follow-up Information     Ob/Gyn, [facility]. Schedule an appointment as soon as possible for a visit in 4 week(s).   Why: routine postpartum visit Contact information: [place] [address] [phone]                  Please schedule this patient for a In person postpartum visit in 4 weeks with the following provider: MD.  Delivery mode:  Vaginal, Spontaneous  Anticipated Birth Control:  Unsure   11/29/2021 Logan Bores, MD

## 2021-11-29 NOTE — Progress Notes (Signed)
Post Partum Day 1 Subjective: up ad lib, voiding, and tolerating PO  She feels tired but otherwise no issues, baby nursing very well.  She would like early d/c home this PM   Objective: Blood pressure 99/69, pulse 71, temperature 98.2 F (36.8 C), temperature source Axillary, resp. rate 18, height 5\' 5"  (1.651 m), weight 73.9 kg, last menstrual period 02/22/2021, SpO2 100 %, unknown if currently breastfeeding.  Physical Exam:  General: alert and cooperative Lochia: appropriate Uterine Fundus: firm   Recent Labs    11/27/21 2224 11/29/21 0500  HGB 9.9* 8.7*  HCT 31.1* 27.3*    Assessment/Plan: Discharge home if baby able to go Desires neonatal circumcision, R/B/A of procedure discussed at length. Pt understands that neonatal circumcision is not considered medically necessary and is elective. We discussed risks and benefits in detail F/u for routine pp visit in 4 weeks  LOS: 2 days   Logan Bores, MD 11/29/2021, 8:07 AM

## 2021-11-30 ENCOUNTER — Inpatient Hospital Stay (HOSPITAL_COMMUNITY): Payer: Medicaid Other

## 2021-11-30 ENCOUNTER — Inpatient Hospital Stay (HOSPITAL_COMMUNITY)
Admission: AD | Admit: 2021-11-30 | Payer: Medicaid Other | Source: Home / Self Care | Admitting: Obstetrics and Gynecology

## 2021-12-08 ENCOUNTER — Telehealth (HOSPITAL_COMMUNITY): Payer: Self-pay

## 2021-12-08 NOTE — Telephone Encounter (Signed)
Patient did not answer phone call. Voicemail left for patient.   Sharyn Lull [facility] 12/08/2021,1508

## 2021-12-30 DIAGNOSIS — Z419 Encounter for procedure for purposes other than remedying health state, unspecified: Secondary | ICD-10-CM | POA: Diagnosis not present

## 2022-01-29 DIAGNOSIS — Z419 Encounter for procedure for purposes other than remedying health state, unspecified: Secondary | ICD-10-CM | POA: Diagnosis not present

## 2022-03-01 DIAGNOSIS — Z419 Encounter for procedure for purposes other than remedying health state, unspecified: Secondary | ICD-10-CM | POA: Diagnosis not present

## 2022-04-01 DIAGNOSIS — Z419 Encounter for procedure for purposes other than remedying health state, unspecified: Secondary | ICD-10-CM | POA: Diagnosis not present

## 2022-04-30 DIAGNOSIS — Z419 Encounter for procedure for purposes other than remedying health state, unspecified: Secondary | ICD-10-CM | POA: Diagnosis not present

## 2022-05-31 DIAGNOSIS — Z419 Encounter for procedure for purposes other than remedying health state, unspecified: Secondary | ICD-10-CM | POA: Diagnosis not present

## 2022-06-30 DIAGNOSIS — Z419 Encounter for procedure for purposes other than remedying health state, unspecified: Secondary | ICD-10-CM | POA: Diagnosis not present

## 2022-07-31 DIAGNOSIS — Z419 Encounter for procedure for purposes other than remedying health state, unspecified: Secondary | ICD-10-CM | POA: Diagnosis not present

## 2022-08-30 DIAGNOSIS — Z419 Encounter for procedure for purposes other than remedying health state, unspecified: Secondary | ICD-10-CM | POA: Diagnosis not present

## 2022-09-30 DIAGNOSIS — Z419 Encounter for procedure for purposes other than remedying health state, unspecified: Secondary | ICD-10-CM | POA: Diagnosis not present

## 2022-10-31 DIAGNOSIS — Z419 Encounter for procedure for purposes other than remedying health state, unspecified: Secondary | ICD-10-CM | POA: Diagnosis not present

## 2022-11-30 DIAGNOSIS — Z419 Encounter for procedure for purposes other than remedying health state, unspecified: Secondary | ICD-10-CM | POA: Diagnosis not present

## 2022-12-31 DIAGNOSIS — Z419 Encounter for procedure for purposes other than remedying health state, unspecified: Secondary | ICD-10-CM | POA: Diagnosis not present

## 2023-02-01 IMAGING — US US OB < 14 WEEKS - US OB TV
1 series · 15 of 28 positions shown · non-contrast
Comparison: None.

CLINICAL DATA: 35-year-old pregnant female with vaginal bleeding.
LMP: 08/29/2020 corresponding to an estimated gestational age 11
weeks, 6 days.

EXAM:
OBSTETRIC <14 WK US AND TRANSVAGINAL OB US
TECHNIQUE: Both transabdominal and transvaginal ultrasound examinations were
performed for complete evaluation of the gestation as well as the
maternal uterus, adnexal regions, and pelvic cul-de-sac.
Transvaginal technique was performed to assess early pregnancy.

[Series 1: us ob < 14 weeks - us ob tv · 15 of 48 slices shown]
[im 1/48]
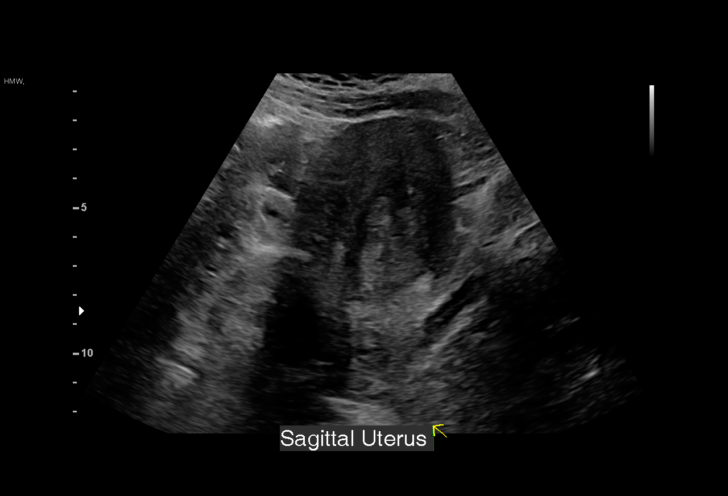
[im 4/48]
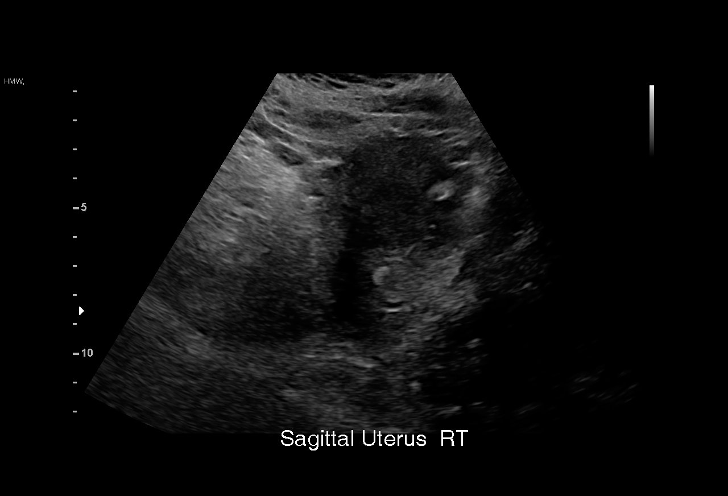
[im 7/48]
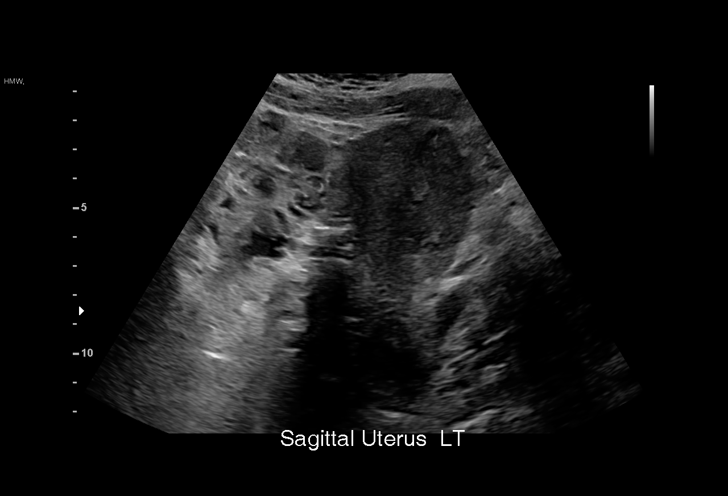
[im 11/48]
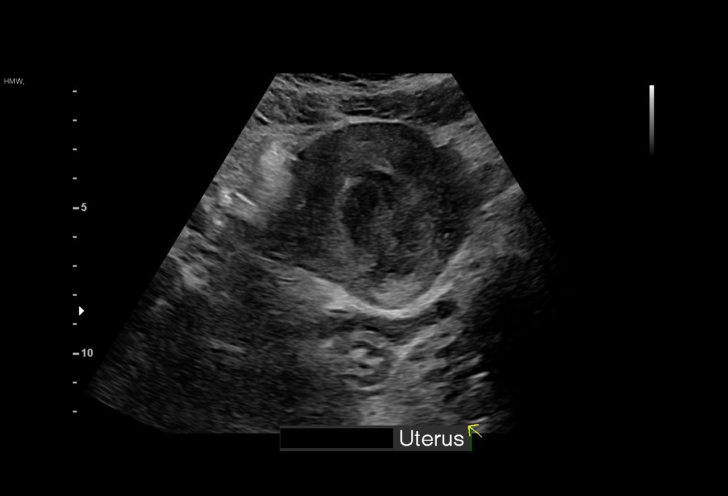
[im 14/48]
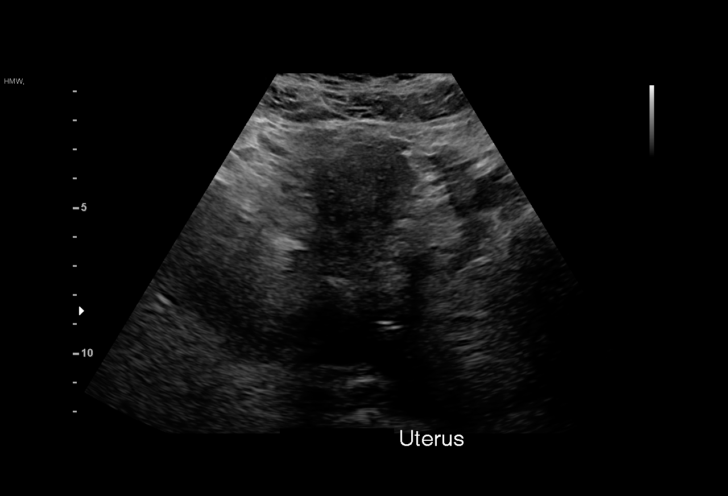
[im 18/48]
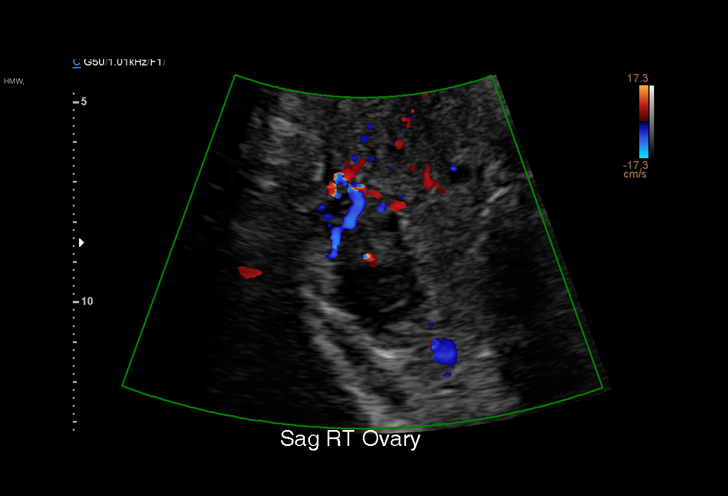
[im 21/48]
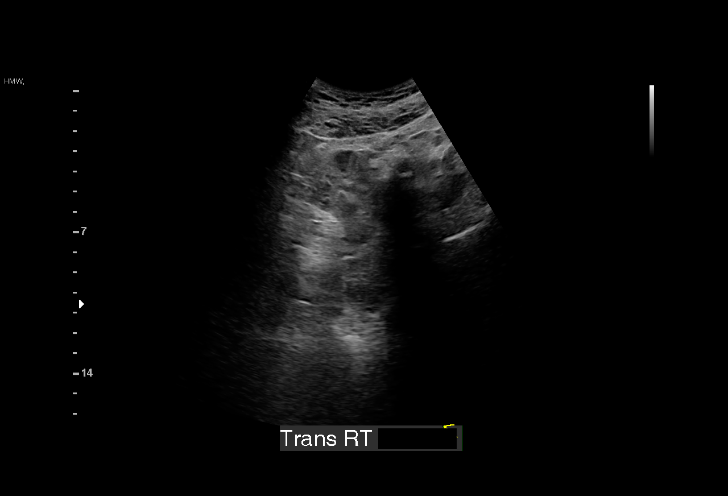
[im 25/48]
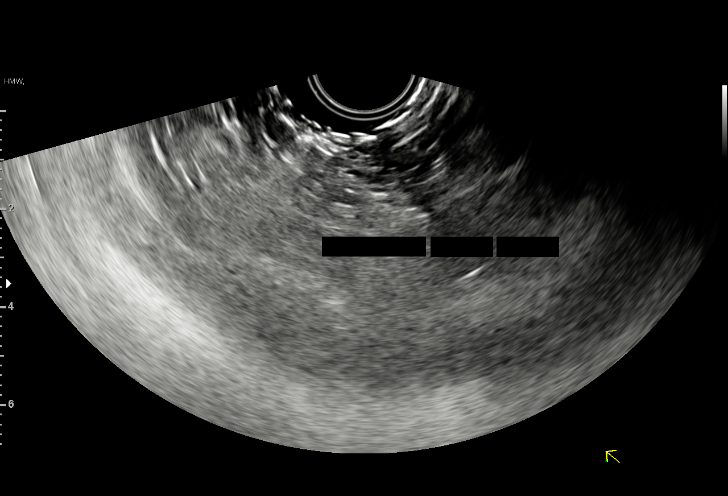
[im 27/48]
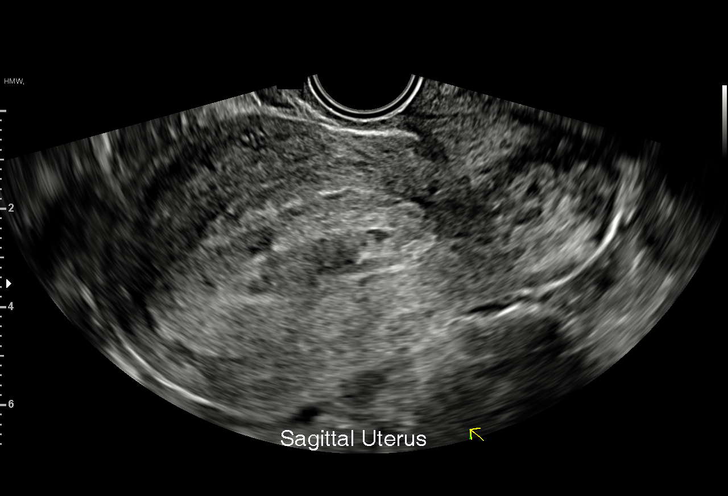
[im 30/48]
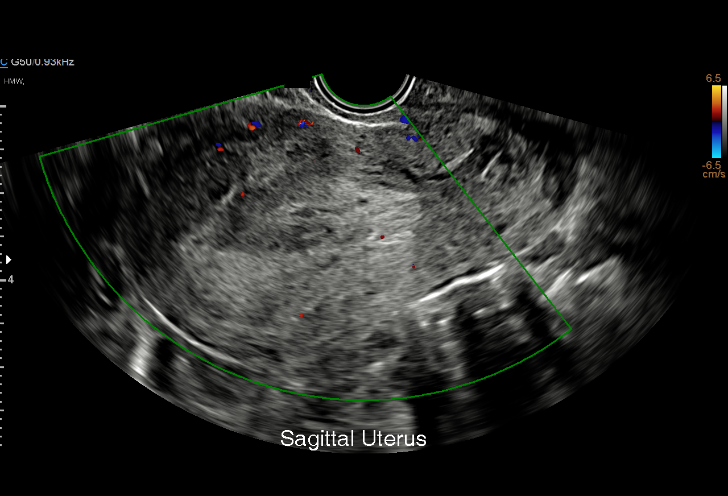
[im 34/48]
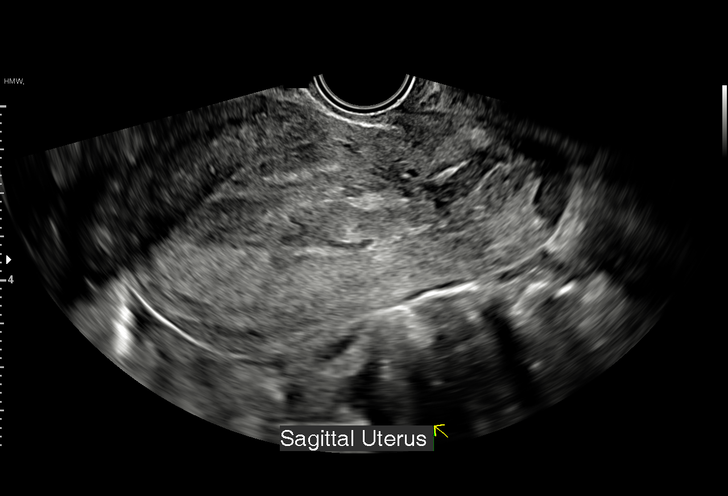
[im 37/48]
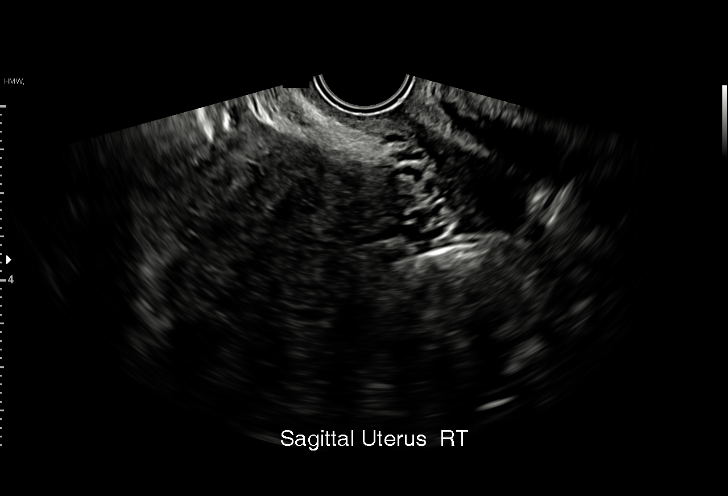
[im 41/48]
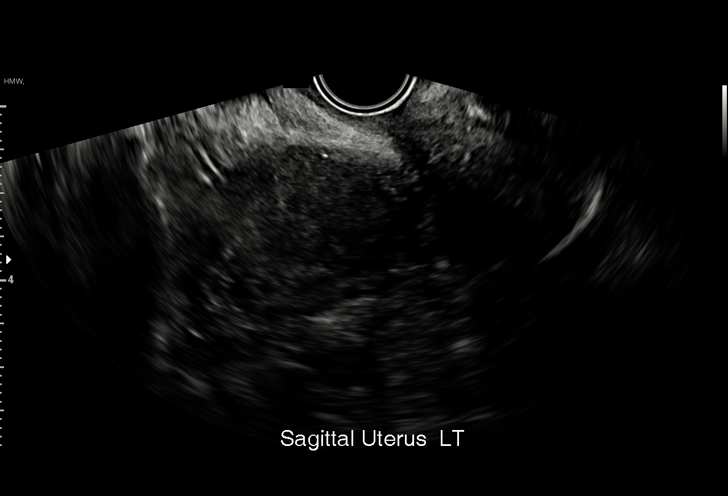
[im 44/48]
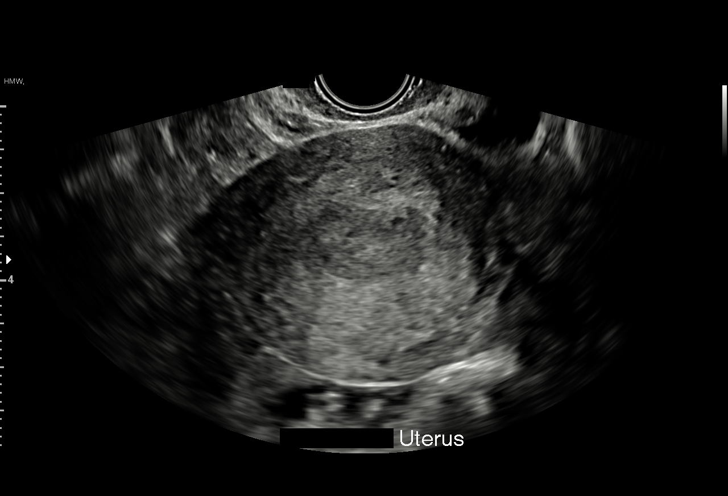
[im 48/48]
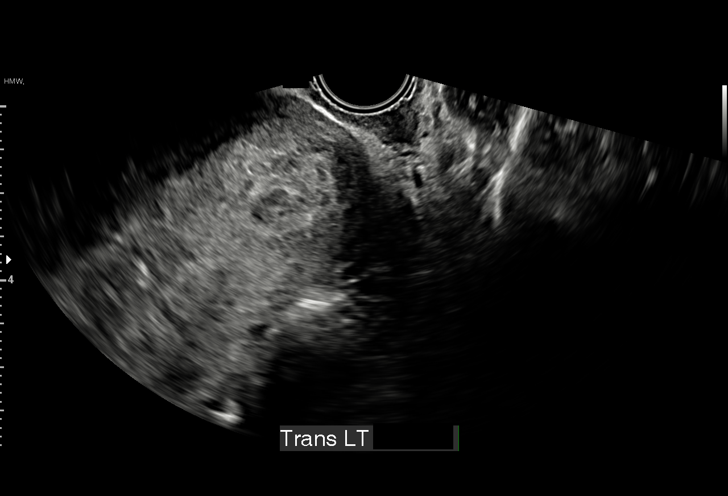

[15 of 28 positions shown; findings below may reference images not displayed]

FINDINGS: The uterus is anteverted and appears unremarkable.

The endometrium is thickened and heterogeneous measuring up to
approximately 3 cm. There is some vascularity within the upper
endometrium.

No intrauterine pregnancy identified. Although findings are most
consistent with miscarriage, in the absence of previously documented
IUP by ultrasound, the differential diagnosis includes recent
spontaneous miscarriage, early pregnancy, or an occult ectopic
pregnancy.

Heterogeneous endometrial content likely represent blood products or
clot. However, in the presence of vascularity, retained product of
conception is not excluded. Correlation with clinical exam and
follow-up with serial hCG levels recommended.

The right ovary is unremarkable.  The left ovary is not visualized.

No significant free fluid in the pelvis.
IMPRESSION: No intrauterine pregnancy identified. Findings most consistent with
recent spontaneous miscarriage with large blood product or clot
within the endometrium. Retained product of conception is not
excluded. Correlation with clinical exam and follow-up with serial
HCG levels recommended.

## 2023-07-05 ENCOUNTER — Emergency Department (HOSPITAL_BASED_OUTPATIENT_CLINIC_OR_DEPARTMENT_OTHER)

## 2023-07-05 ENCOUNTER — Emergency Department (HOSPITAL_BASED_OUTPATIENT_CLINIC_OR_DEPARTMENT_OTHER)
Admission: EM | Admit: 2023-07-05 | Discharge: 2023-07-05 | Disposition: A | Discharge status: Home / Self Care | Attending: Emergency Medicine | Admitting: Emergency Medicine

## 2023-07-05 ENCOUNTER — Other Ambulatory Visit: Payer: Self-pay

## 2023-07-05 DIAGNOSIS — Y92410 Unspecified street and highway as the place of occurrence of the external cause: Secondary | ICD-10-CM | POA: Diagnosis not present

## 2023-07-05 DIAGNOSIS — S060X0A Concussion without loss of consciousness, initial encounter: Secondary | ICD-10-CM | POA: Diagnosis not present

## 2023-07-05 DIAGNOSIS — S0093XA Contusion of unspecified part of head, initial encounter: Secondary | ICD-10-CM

## 2023-07-05 DIAGNOSIS — S0990XA Unspecified injury of head, initial encounter: Secondary | ICD-10-CM | POA: Diagnosis present

## 2023-07-05 DIAGNOSIS — S0003XA Contusion of scalp, initial encounter: Secondary | ICD-10-CM | POA: Diagnosis not present

## 2023-07-05 MED ORDER — IBUPROFEN 400 MG PO TABS
400.0000 mg | ORAL_TABLET | Freq: Once | ORAL | Status: AC
  Administered 2023-07-05: 400 mg via ORAL
  Filled 2023-07-05: qty 1

## 2023-07-05 MED ORDER — ACETAMINOPHEN 325 MG PO TABS
650.0000 mg | ORAL_TABLET | Freq: Once | ORAL | Status: AC
  Administered 2023-07-05: 650 mg via ORAL
  Filled 2023-07-05: qty 2

## 2023-07-05 MED ORDER — OXYCODONE-ACETAMINOPHEN 5-325 MG PO TABS
1.0000 | ORAL_TABLET | Freq: Once | ORAL | Status: AC
  Administered 2023-07-05: 1 via ORAL
  Filled 2023-07-05: qty 1

## 2023-07-05 MED ORDER — ONDANSETRON HCL 4 MG PO TABS
4.0000 mg | ORAL_TABLET | Freq: Once | ORAL | Status: AC
  Administered 2023-07-05: 4 mg via ORAL
  Filled 2023-07-05: qty 1

## 2023-07-05 MED ORDER — METHOCARBAMOL 500 MG PO TABS: 500.0000 mg | ORAL_TABLET | Freq: Two times a day (BID) | ORAL | 0 refills | Status: AC
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
## 2023-07-05 NOTE — Discharge Instructions (Signed)
 While you were in the emergency room, you had CT imaging done that was negative.  Your blood work overall was normal.  Like we discussed, tomorrow you will likely feel more sore than you do today, and over the next few days you will continue to have pain and discomfort.  I have sent you prescriptions for medications.  You may begin taking those as prescribed.  Return to the emergency room if you develop difficulty with your breathing, lose consciousness, or unable to walk.    Your CT scan of your head and your neck were negative.  Like we discussed, you likely have a concussion.  This is essentially a bruise on your brain.  Often times in people have a concussion, they can feel headaches, nauseated, or feel sensitive to light.  It can be difficult to say what will make the symptoms worse, but that is your brains way of saying that you need to stop what you are doing and rest.  You can take Motrin , Tylenol  and Zofran  for this.  Most people symptoms will improve over days to weeks.  Return to the emergency room if you lose, has noticed, or develop persistent vomiting.
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
## 2023-08-01 ENCOUNTER — Other Ambulatory Visit: Payer: Self-pay

## 2023-08-01 ENCOUNTER — Ambulatory Visit
Admission: RE | Admit: 2023-08-01 | Discharge: 2023-08-01 | Disposition: A | Discharge status: Home / Self Care | Source: Ambulatory Visit

## 2023-08-01 DIAGNOSIS — M542 Cervicalgia: Secondary | ICD-10-CM

## 2023-08-01 DIAGNOSIS — M546 Pain in thoracic spine: Secondary | ICD-10-CM

## 2023-08-01 DIAGNOSIS — M545 Low back pain, unspecified: Secondary | ICD-10-CM

## 2024-02-01 ENCOUNTER — Emergency Department (HOSPITAL_BASED_OUTPATIENT_CLINIC_OR_DEPARTMENT_OTHER)

## 2024-02-01 ENCOUNTER — Encounter (HOSPITAL_BASED_OUTPATIENT_CLINIC_OR_DEPARTMENT_OTHER): Payer: Self-pay

## 2024-02-01 ENCOUNTER — Emergency Department (HOSPITAL_BASED_OUTPATIENT_CLINIC_OR_DEPARTMENT_OTHER): Admitting: Radiology

## 2024-02-01 ENCOUNTER — Other Ambulatory Visit: Payer: Self-pay

## 2024-02-01 ENCOUNTER — Emergency Department (HOSPITAL_BASED_OUTPATIENT_CLINIC_OR_DEPARTMENT_OTHER)
Admission: EM | Admit: 2024-02-01 | Discharge: 2024-02-01 | Disposition: A | Discharge status: Home / Self Care | Attending: Emergency Medicine | Admitting: Emergency Medicine

## 2024-02-01 DIAGNOSIS — S161XXA Strain of muscle, fascia and tendon at neck level, initial encounter: Secondary | ICD-10-CM

## 2024-02-01 DIAGNOSIS — M545 Low back pain, unspecified: Secondary | ICD-10-CM

## 2024-02-01 LAB — PREGNANCY, URINE: Preg Test, Ur: NEGATIVE

## 2024-02-01 MED ORDER — CYCLOBENZAPRINE HCL 10 MG PO TABS: 10.0000 mg | ORAL_TABLET | Freq: Every day | ORAL | 0 refills | Status: AC

## 2024-02-01 MED ORDER — OXYCODONE-ACETAMINOPHEN 5-325 MG PO TABS
1.0000 | ORAL_TABLET | Freq: Once | ORAL | Status: DC
  Filled 2024-02-01: qty 1

## 2024-02-01 MED ORDER — ACETAMINOPHEN 500 MG PO TABS
1000.0000 mg | ORAL_TABLET | Freq: Once | ORAL | Status: AC
  Administered 2024-02-01: 1000 mg via ORAL
  Filled 2024-02-01: qty 2
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
## 2024-04-06 ENCOUNTER — Emergency Department (HOSPITAL_BASED_OUTPATIENT_CLINIC_OR_DEPARTMENT_OTHER)
Admission: EM | Admit: 2024-04-06 | Source: Home / Self Care | Attending: Emergency Medicine | Admitting: Emergency Medicine

## 2024-04-06 ENCOUNTER — Other Ambulatory Visit: Payer: Self-pay

## 2024-04-06 ENCOUNTER — Encounter (HOSPITAL_BASED_OUTPATIENT_CLINIC_OR_DEPARTMENT_OTHER): Payer: Self-pay

## 2024-04-06 ENCOUNTER — Emergency Department (HOSPITAL_BASED_OUTPATIENT_CLINIC_OR_DEPARTMENT_OTHER)

## 2024-04-06 ENCOUNTER — Emergency Department (HOSPITAL_BASED_OUTPATIENT_CLINIC_OR_DEPARTMENT_OTHER): Admitting: Radiology

## 2024-04-06 DIAGNOSIS — N3000 Acute cystitis without hematuria: Secondary | ICD-10-CM

## 2024-04-06 DIAGNOSIS — J188 Other pneumonia, unspecified organism: Secondary | ICD-10-CM | POA: Diagnosis present

## 2024-04-06 DIAGNOSIS — A419 Sepsis, unspecified organism: Secondary | ICD-10-CM

## 2024-04-06 DIAGNOSIS — J189 Pneumonia, unspecified organism: Secondary | ICD-10-CM

## 2024-04-06 LAB — COMPREHENSIVE METABOLIC PANEL WITH GFR
ALT: 59 U/L — ABNORMAL HIGH (ref 0–44)
AST: 41 U/L (ref 15–41)
Albumin: 4 g/dL (ref 3.5–5.0)
Alkaline Phosphatase: 137 U/L — ABNORMAL HIGH (ref 38–126)
Anion gap: 17 — ABNORMAL HIGH (ref 5–15)
BUN: 6 mg/dL (ref 6–20)
CO2: 23 mmol/L (ref 22–32)
Calcium: 10 mg/dL (ref 8.9–10.3)
Chloride: 93 mmol/L — ABNORMAL LOW (ref 98–111)
Creatinine, Ser: 0.63 mg/dL (ref 0.44–1.00)
GFR, Estimated: >60 mL/min
Glucose, Bld: 106 mg/dL — ABNORMAL HIGH (ref 70–99)
Potassium: 3.8 mmol/L (ref 3.5–5.1)
Sodium: 133 mmol/L — ABNORMAL LOW (ref 135–145)
Total Bilirubin: 0.7 mg/dL (ref 0.0–1.2)
Total Protein: 8.8 g/dL — ABNORMAL HIGH (ref 6.5–8.1)

## 2024-04-06 LAB — CBC
HCT: 35.8 % — ABNORMAL LOW (ref 36.0–46.0)
Hemoglobin: 11.9 g/dL — ABNORMAL LOW (ref 12.0–15.0)
MCH: 27.7 pg (ref 26.0–34.0)
MCHC: 33.2 g/dL (ref 30.0–36.0)
MCV: 83.4 fL (ref 80.0–100.0)
Platelets: 499 10*3/uL — ABNORMAL HIGH (ref 150–400)
RBC: 4.29 MIL/uL (ref 3.87–5.11)
RDW: 12.8 % (ref 11.5–15.5)
WBC: 20 10*3/uL — ABNORMAL HIGH (ref 4.0–10.5)
nRBC: 0 % (ref 0.0–0.2)

## 2024-04-06 LAB — URINALYSIS, ROUTINE W REFLEX MICROSCOPIC
Bilirubin Urine: NEGATIVE
Glucose, UA: NEGATIVE mg/dL
Hgb urine dipstick: NEGATIVE
Ketones, ur: 15 mg/dL — AB
Nitrite: NEGATIVE
Specific Gravity, Urine: 1.013 (ref 1.005–1.030)
pH: 5.5 (ref 5.0–8.0)

## 2024-04-06 LAB — RESP PANEL BY RT-PCR (RSV, FLU A&B, COVID)  RVPGX2
Influenza A by PCR: NEGATIVE
Influenza B by PCR: NEGATIVE
Resp Syncytial Virus by PCR: NEGATIVE
SARS Coronavirus 2 by RT PCR: NEGATIVE

## 2024-04-06 LAB — LIPASE, BLOOD: Lipase: 27 U/L (ref 11–51)

## 2024-04-06 LAB — PREGNANCY, URINE: Preg Test, Ur: NEGATIVE

## 2024-04-06 LAB — LACTIC ACID, PLASMA: Lactic Acid, Venous: 1.1 mmol/L (ref 0.5–1.9)

## 2024-04-06 MED ORDER — ONDANSETRON HCL 4 MG/2ML IJ SOLN: 4.0000 mg | Freq: Four times a day (QID) | INTRAMUSCULAR | Status: AC | PRN

## 2024-04-06 MED ORDER — VANCOMYCIN HCL IN DEXTROSE 1-5 GM/200ML-% IV SOLN
1000.0000 mg | Freq: Once | INTRAVENOUS | Status: AC
  Administered 2024-04-06: 1000 mg via INTRAVENOUS
  Filled 2024-04-06: qty 200

## 2024-04-06 MED ORDER — IOHEXOL 300 MG/ML  SOLN
100.0000 mL | Freq: Once | INTRAMUSCULAR | Status: AC | PRN
  Administered 2024-04-06: 100 mL via INTRAVENOUS

## 2024-04-06 MED ORDER — MELATONIN 3 MG PO TABS
3.0000 mg | ORAL_TABLET | Freq: Every day | ORAL | Status: AC
  Administered 2024-04-06: 3 mg via ORAL
  Filled 2024-04-06: qty 1

## 2024-04-06 MED ORDER — SODIUM CHLORIDE 0.9 % IV BOLUS (SEPSIS)
1000.0000 mL | Freq: Once | INTRAVENOUS | Status: AC
  Administered 2024-04-06: 1000 mL via INTRAVENOUS

## 2024-04-06 MED ORDER — ACETAMINOPHEN 325 MG PO TABS: 650.0000 mg | ORAL_TABLET | Freq: Four times a day (QID) | ORAL | Status: AC | PRN

## 2024-04-06 MED ORDER — ALUM & MAG HYDROXIDE-SIMETH 200-200-20 MG/5ML PO SUSP
30.0000 mL | Freq: Once | ORAL | Status: AC
  Filled 2024-04-06: qty 30

## 2024-04-06 MED ORDER — ONDANSETRON HCL 4 MG/2ML IJ SOLN
4.0000 mg | Freq: Once | INTRAMUSCULAR | Status: AC
  Administered 2024-04-06: 4 mg via INTRAVENOUS
  Filled 2024-04-06: qty 2

## 2024-04-06 MED ORDER — KETOROLAC TROMETHAMINE 15 MG/ML IJ SOLN
15.0000 mg | Freq: Once | INTRAMUSCULAR | Status: AC
  Administered 2024-04-06: 15 mg via INTRAVENOUS
  Filled 2024-04-06: qty 1

## 2024-04-06 MED ORDER — ONDANSETRON 4 MG PO TBDP
4.0000 mg | ORAL_TABLET | Freq: Once | ORAL | Status: AC | PRN
  Administered 2024-04-06: 4 mg via ORAL
  Filled 2024-04-06: qty 1

## 2024-04-06 MED ORDER — SODIUM CHLORIDE 0.9 % IV SOLN
2.0000 g | Freq: Once | INTRAVENOUS | Status: AC
  Administered 2024-04-06: 2 g via INTRAVENOUS
  Filled 2024-04-06: qty 12.5

## 2024-04-06 MED ORDER — LACTATED RINGERS IV SOLN: INTRAVENOUS | Status: AC

## 2024-04-06 MED ORDER — ACETAMINOPHEN 325 MG PO TABS
650.0000 mg | ORAL_TABLET | Freq: Once | ORAL | Status: AC
  Administered 2024-04-06: 650 mg via ORAL
  Filled 2024-04-06: qty 2

## 2024-04-06 MED ORDER — LIDOCAINE VISCOUS HCL 2 % MT SOLN
15.0000 mL | Freq: Once | OROMUCOSAL | Status: AC
  Filled 2024-04-06: qty 15

## 2024-04-06 MED ORDER — MORPHINE SULFATE (PF) 4 MG/ML IV SOLN: 4.0000 mg | INTRAVENOUS | Status: AC | PRN

## 2024-04-06 NOTE — ED Notes (Signed)
 Blood cultures drawn x2 before starting antibiotics.

## 2024-04-06 NOTE — ED Provider Notes (Cosign Needed)
 " [facility] AT [facility] Provider Note   CSN: [id number] Arrival date & time: 04/06/24  1650    Patient presents with: Influenza (/)   Gail Sims is a 39 y.o. female here for evaluation of fever and cough.  Patient noted 3 weeks ago her entire family was sick.  She suspected this was likely an influenza-like illness.  The rest of her family improved in her symptoms however she continued to have persistent cough productive of brown sputum, subjective fevers, chest pain with coughing, abdominal pain, nausea, vomiting.  She notes she has had some night sweats as well.  She think she has had some weight loss however relates this due to persistent nausea and vomiting. No bloody emesis. Did note last week she had a bowel movement that had some bright red blood however this resolved.  No pain or swelling to legs.  No history of PE or DVT.  No recent surgery, ulceration, malignancy, travel.  No sick contacts.  No communal living, incarceration, exposed to anyone with travel out of the country, exposure to tuberculosis.  She denies any prior medical problems.   HPI     Prior to Admission medications  Medication Sig Start Date End Date Taking? Authorizing Provider  acetaminophen  (TYLENOL ) 325 MG tablet Take 2 tablets (650 mg total) by mouth every 4 (four) hours as needed (for pain scale < 4). 11/29/21   Estelle Service, MD  albuterol (PROVENTIL HFA;VENTOLIN HFA) 108 (90 Base) MCG/ACT inhaler USE 2 PUFFS PO Q 4 TO 6 H PRN SOB AND WHEEZING 04/26/17   [provider]  cyclobenzaprine  (FLEXERIL ) 10 MG tablet Take 1 tablet (10 mg total) by mouth at bedtime. 02/01/24   Zelaya, Oscar A, PA-C  ibuprofen  (ADVIL ) 600 MG tablet Take 1 tablet (600 mg total) by mouth every 6 (six) hours. 11/29/21   Estelle Service, MD  methocarbamol  (ROBAXIN ) 500 MG tablet Take 1 tablet (500 mg total) by mouth 2 (two) times daily. 07/05/23   Mannie Pac T, DO  polyethylene glycol  (MIRALAX ) 17 g packet Take 17 g by mouth daily. 11/20/20   Trudy Earnie CROME, CNM    Allergies: Pollen extract, Other, and Bovine (beef) protein-containing drug products    Review of Systems  Constitutional:  Positive for activity change, appetite change, chills, fatigue and fever.  HENT: Negative.    Respiratory:  Positive for cough and shortness of breath.   Cardiovascular:  Positive for chest pain. Negative for palpitations and leg swelling.  Gastrointestinal:  Positive for abdominal pain, nausea and vomiting. Negative for constipation, diarrhea and rectal pain.  Genitourinary: Negative.   Musculoskeletal:  Positive for myalgias. Negative for neck pain and neck stiffness.  Skin: Negative.   Neurological:  Positive for weakness (generalized).  All other systems reviewed and are negative.   Updated Vital Signs BP 121/78   Pulse 89   Temp 99.8 F (37.7 C) (Oral)   Resp 13   Ht 5' 5 (1.651 m)   Wt 61.2 kg   LMP 03/30/2024   SpO2 99%   BMI 22.47 kg/m   Physical Exam Vitals and nursing note reviewed.  Constitutional:      General: She is not in acute distress.    Appearance: She is well-developed. She is ill-appearing. She is not toxic-appearing or diaphoretic.  HENT:     Head: Normocephalic and atraumatic.     Nose: Nose normal.     Mouth/Throat:     Mouth: Mucous membranes are  dry.  Eyes:     Pupils: Pupils are equal, round, and reactive to light.  Cardiovascular:     Rate and Rhythm: Regular rhythm. Tachycardia present.     Pulses: Normal pulses.          Radial pulses are 2+ on the right side and 2+ on the left side.       Dorsalis pedis pulses are 2+ on the right side and 2+ on the left side.     Heart sounds: Normal heart sounds.  Pulmonary:     Effort: Pulmonary effort is normal. No respiratory distress.     Breath sounds: Normal breath sounds.     Comments: Coarse lung sound right mid lung.  Speaks without difficulty Abdominal:     General: Bowel sounds are  normal. There is no distension.     Palpations: Abdomen is soft.     Tenderness: There is no abdominal tenderness. There is no right CVA tenderness, left CVA tenderness or guarding.     Comments: Soft, nontender, no rebound or guard  Musculoskeletal:        General: Normal range of motion.     Cervical back: Normal range of motion and neck supple.     Right lower leg: No edema.     Left lower leg: No edema.     Comments: No bony tenderness, compartments soft.  No lower extremity edema  Skin:    General: Skin is warm and dry.     Capillary Refill: Capillary refill takes less than 2 seconds.     Comments: No obvious rashes or lesions on exposed skin  Neurological:     General: No focal deficit present.     Mental Status: She is alert and oriented to person, place, and time.     Cranial Nerves: No cranial nerve deficit.     Sensory: No sensory deficit.     Motor: No weakness.     Gait: Gait normal.    (all labs ordered are listed, but only abnormal results are displayed) Labs Reviewed  COMPREHENSIVE METABOLIC PANEL WITH GFR - Abnormal; Notable for the following components:      Result Value   Sodium 133 (*)    Chloride 93 (*)    Glucose, Bld 106 (*)    Total Protein 8.8 (*)    ALT 59 (*)    Alkaline Phosphatase 137 (*)    Anion gap 17 (*)    All other components within normal limits  CBC - Abnormal; Notable for the following components:   WBC 20.0 (*)    Hemoglobin 11.9 (*)    HCT 35.8 (*)    Platelets 499 (*)    All other components within normal limits  URINALYSIS, ROUTINE W REFLEX MICROSCOPIC - Abnormal; Notable for the following components:   APPearance HAZY (*)    Ketones, ur 15 (*)    Protein, ur TRACE (*)    Leukocytes,Ua MODERATE (*)    Bacteria, UA MANY (*)    All other components within normal limits  RESP PANEL BY RT-PCR (RSV, FLU A&B, COVID)  RVPGX2  CULTURE, BLOOD (ROUTINE X 2)  CULTURE, BLOOD (ROUTINE X 2)  ACID FAST CULTURE WITH REFLEXED SENSITIVITIES  (MYCOBACTERIA)  ACID FAST SMEAR (AFB, MYCOBACTERIA)  LIPASE, BLOOD  PREGNANCY, URINE  LACTIC ACID, PLASMA  QUANTIFERON-TB GOLD PLUS    EKG: None  Radiology: CT CHEST ABDOMEN PELVIS W CONTRAST Result Date: 04/06/2024 EXAM: CT CHEST, ABDOMEN AND PELVIS WITH CONTRAST 04/06/2024  10:06:34 PM TECHNIQUE: CT of the chest, abdomen and pelvis was performed with the administration of 100 mL iohexol  (OMNIPAQUE ) 300 MG/ML solution. Multiplanar reformatted images are provided for review. Automated exposure control, iterative reconstruction, and/or weight based adjustment of the mA/kV was utilized to reduce the radiation dose to as low as reasonably achievable. COMPARISON: None available. CLINICAL HISTORY: Cavitary lesion on recent. FINDINGS: CHEST: MEDIASTINUM AND LYMPH NODES: Heart and pericardium are unremarkable. No cardiac enlargement is seen. The central airways are clear. The thoracic aorta and its branches are within normal limits. The pulmonary artery, as visualized, is within normal limits. No sizable mediastinal or hilar lymph nodes are seen. No axillary lymphadenopathy. LUNGS AND PLEURA: The lungs are well aerated bilaterally. A cavitary lesion is noted in the superior segment of the right lower lobe. It abuts the major fissure and shows a small air-fluid level within. The lesion measures 4.4 x 3.7 cm in greatest AP and transverse dimensions, respectively. A second thin-walled cavitary lesion is noted within the left upper lobe, best seen on image number 60 of series 3. These changes are most consistent with multifocal cavitary pneumonia although the possibility of neoplasm deserves consideration, short-term follow-up following appropriate therapy is recommended. The lungs are otherwise clear. No pleural effusion. No pneumothorax. ABDOMEN AND PELVIS: LIVER: The liver is within normal limits. GALLBLADDER AND BILE DUCTS: The gallbladder is within normal limits. No biliary ductal dilatation. SPLEEN: No acute  abnormality. PANCREAS: No acute abnormality. ADRENAL GLANDS: No acute abnormality. KIDNEYS, URETERS AND BLADDER: Kidneys demonstrate a normal enhancement pattern. A simple cyst is noted in the lower pole of the right kidney. Per consensus, no follow-up is needed for simple Bosniak type 1 and 2 renal cysts, unless the patient has a malignancy history or risk factors. No stones in the kidneys or ureters. No hydronephrosis. No perinephric or periureteral stranding. Urinary bladder is unremarkable. GI AND BOWEL: Stomach demonstrates no acute abnormality. The appendix is within normal limits. There is no bowel obstruction. REPRODUCTIVE ORGANS: The uterus is within normal limits. No adnexal mass is seen. PERITONEUM AND RETROPERITONEUM: No ascites. No free air. VASCULATURE: Aorta is normal in caliber. ABDOMINAL AND PELVIS LYMPH NODES: No lymphadenopathy. BONES AND SOFT TISSUES: Bony structures are unremarkable in the chest. No acute osseous abnormality in the abdomen and pelvis. No focal soft tissue abnormality. IMPRESSION: 1. Multifocal cavitary pneumonia, including a 4.4 x 3.7 cm cavitary lesion in the superior segment of the right lower lobe with a small air-fluid level and a second thin-walled cavitary lesion in the left upper lobe. 2. Underlying neoplasm remains a consideration but felt to be less likely. . 3. Recommend short-term follow-up chest CT following appropriate therapy. Electronically signed by: Oneil Devonshire MD 04/06/2024 10:18 PM EST RP Workstation: [id number]   DG Chest 2 View Result Date: 04/06/2024 EXAM: 2 VIEW(S) XRAY OF THE CHEST 04/06/2024 07:36:45 PM COMPARISON: None available. CLINICAL HISTORY: Shortness of breath. FINDINGS: LUNGS AND PLEURA: 4 cm cavitary lesion in right perihilar region and projects posteriorly on the lateral film. A small air-fluid level was noted within. No pleural effusion. No pneumothorax. HEART AND MEDIASTINUM: No acute abnormality of the cardiac and mediastinal silhouettes.  BONES AND SOFT TISSUES: No acute osseous abnormality. IMPRESSION: 1. 4 cm cavitary lesion in the right perihilar region with a small air-fluid level. 2. CT chest with contrast is recommended for further evaluation. Electronically signed by: Oneil Devonshire MD 04/06/2024 07:41 PM EST RP Workstation: [id number]     .Critical Care  Performed  by: Edie Rosebud LABOR, PA-C Authorized by: Edie Rosebud LABOR, PA-C   Critical care provider statement:    Critical care time (minutes):  35   Critical care was necessary to treat or prevent imminent or life-threatening deterioration of the following conditions:  Sepsis   Critical care was time spent personally by me on the following activities:  Development of treatment plan with patient or surrogate, discussions with consultants, evaluation of patient's response to treatment, examination of patient, ordering and review of laboratory studies, ordering and review of radiographic studies, ordering and performing treatments and interventions, pulse oximetry, re-evaluation of patient's condition and review of old charts    Medications Ordered in the ED  lactated ringers  infusion ( Intravenous New Bag/Given 04/06/24 2302)  melatonin tablet 3 mg (3 mg Oral Given 04/06/24 2313)  ondansetron  (ZOFRAN ) injection 4 mg (has no administration in time range)  acetaminophen  (TYLENOL ) tablet 650 mg (has no administration in time range)  morphine  (PF) 4 MG/ML injection 4 mg (has no administration in time range)  ondansetron  (ZOFRAN -ODT) disintegrating tablet 4 mg (4 mg Oral Given 04/06/24 1659)  sodium chloride  0.9 % bolus 1,000 mL (0 mLs Intravenous Stopped 04/06/24 2148)    And  sodium chloride  0.9 % bolus 1,000 mL (0 mLs Intravenous Stopped 04/06/24 2149)  vancomycin  (VANCOCIN ) IVPB 1000 mg/200 mL premix (0 mg Intravenous Stopped 04/06/24 2235)  ceFEPIme  (MAXIPIME ) 2 g in sodium chloride  0.9 % 100 mL IVPB (0 g Intravenous Stopped 04/06/24 2147)  acetaminophen  (TYLENOL ) tablet 650 mg  (650 mg Oral Given 04/06/24 2049)  ondansetron  (ZOFRAN ) injection 4 mg (4 mg Intravenous Given 04/06/24 2110)  iohexol  (OMNIPAQUE ) 300 MG/ML solution 100 mL (100 mLs Intravenous Contrast Given 04/06/24 2206)  ketorolac  (TORADOL ) 15 MG/ML injection 15 mg (15 mg Intravenous Given 04/06/24 5336)   39 year old otherwise healthy female here for evaluation of fever and feeling unwell over the last 3 weeks.  On arrival she has an oral temp of 99.9.  She is tachycardic.  Appears clinically dehydrated.  Notes that her entire family was sick with an influenza-like illness about 3 weeks ago, they improved however she did not.  Still having persistent subjective fevers at home, cough, nausea, vomiting, generalized abdominal pain.  Here she has no clinical evidence of VTE.  She does have some rhonchi to her right mid lung.  She denies any risk factors for tuberculosis.  She had a workup that was started in triage.  After I initially saw patient had sepsis call.  She was given IV antibiotics as well as cefepime  and vancomycin .  Will add on quantgold for Tb  Labs and imaging personally viewed interpreted:  COVID, flu, RSV neg Pregnancy test neg UA positive for infection CBC leukocytosis 20 Metabolic panel sodium 133, anion gap 17, mild elevation in alk phos at 137 Lipase 27 Chest x-ray shows cavitary lesion recommend CT   Discussed results with patient.  Will plan on IV antibiotics, CT chest abdomen pelvis.  Will need admission CT chest abdomen pelvis shows multifocal loculated pneumonia  Discussed results with patient.  Did end up developing fever she was given Tylenol .  She was already started on antibiotics.  Plan for admission for sepsis, loculated pneumonia  CONSULT with Dr. Marcene with medicine who is agreeable to accept patient in transfer for admission.  The patient appears reasonably stabilized for admission considering the current resources, flow, and capabilities available in the ED at this time, and  I doubt any other [facility] requiring further screening  and/or treatment in the ED prior to admission.                                    Medical Decision Making Amount and/or Complexity of Data Reviewed External Data Reviewed: labs, radiology, ECG and notes. Labs: ordered. Decision-making details documented in ED Course. Radiology: ordered and independent interpretation performed. Decision-making details documented in ED Course. ECG/medicine tests: ordered and independent interpretation performed. Decision-making details documented in ED Course.  Risk OTC drugs. Prescription drug management. Decision regarding hospitalization. Diagnosis or treatment significantly limited by social determinants of health.       Final diagnoses:  Sepsis without acute organ dysfunction, due to unspecified organism [facility])  Acute cystitis without hematuria  Cavitary pneumonia    ED Discharge Orders     None          Sidi Dzikowski A, PA-C 04/06/24 2331  "

## 2024-04-06 NOTE — ED Notes (Signed)
 Patient transported to CT

## 2024-04-06 NOTE — Sepsis Progress Note (Signed)
Notified bedside nurse of need to draw and administer antibiotics, lactic acid, and blood cultures.  

## 2024-04-06 NOTE — ED Triage Notes (Signed)
 Pt reports N/V, abd pain, chest pain, sob for several weeks.

## 2024-04-06 NOTE — Sepsis Progress Note (Signed)
 Elink monitoring for the code sepsis protocol.

## 2024-04-06 NOTE — Progress Notes (Incomplete)
 Hospitalist Transfer Note:    Nursing staff, Please call [facility] Admits & Consults System-Wide number on Amion [phone]) as soon as patient's arrival, so appropriate admitting provider can evaluate the pt.  Transferring facility: [facility] Requesting provider: Arthor Captain, PA (EDP at [facility]) Reason for transfer: admission for further evaluation and management of acute diverticulitis in the setting of failed outpatient antibiotics.     39 year old female  who presented to [facility] ED complaining of 3 weeks of progressive lower abdominal discomfort.  Approximately 1 week ago he had presented to his PCP with complaints of lower abdominal discomfort, at which time PCP was able to arrange for outpatient CT abdomen/pelvis which was suggestive of acute diverticulitis.  The patient subsequently completed a course of Cipro/Flagyl, but is noted further ensuing progression of his lower abdominal discomfort, prompting him to present to [facility] this evening.  Vital signs in the ED were notable for the following: Afebrile, heart rates in the 60s to 80s; systolic blood pressures in the 110s to 130s mmHg.   Imaging in the ED today notable for CT abdomen/pelvis with contrast, which showed acute diverticulitis without evidence of obstruction, perforation, or abscess.  Medications administered prior to transfer included the following: Zosyn   Subsequently, I accepted this patient for transfer for inpatient admission to a med/tele bed at [facility] or [facility]  (first available) for further work-up and management of the above.        Newton Pigg, DO Hospitalist
